# Patient Record
Sex: Female | Born: 1975 | Race: Black or African American | Hispanic: No | Marital: Single | Smoking: Never smoker
Health system: Southern US, Community
[De-identification: ages and names within clinical notes are randomized; demographics above are authoritative.]

## PROBLEM LIST (undated history)

## (undated) DIAGNOSIS — Z973 Presence of spectacles and contact lenses: Secondary | ICD-10-CM

## (undated) DIAGNOSIS — J329 Chronic sinusitis, unspecified: Secondary | ICD-10-CM

## (undated) DIAGNOSIS — T783XXA Angioneurotic edema, initial encounter: Secondary | ICD-10-CM

## (undated) DIAGNOSIS — J302 Other seasonal allergic rhinitis: Secondary | ICD-10-CM

## (undated) DIAGNOSIS — A6009 Herpesviral infection of other urogenital tract: Secondary | ICD-10-CM

## (undated) HISTORY — DX: Herpesviral infection of other urogenital tract: A60.09

## (undated) HISTORY — DX: Presence of spectacles and contact lenses: Z97.3

## (undated) HISTORY — DX: Chronic sinusitis, unspecified: J32.9

## (undated) HISTORY — DX: Other seasonal allergic rhinitis: J30.2

## (undated) HISTORY — DX: Angioneurotic edema, initial encounter: T78.3XXA

---

## 2003-05-23 ENCOUNTER — Other Ambulatory Visit: Admission: RE | Admit: 2003-05-23 | Discharge: 2003-05-23 | Payer: Self-pay | Admitting: Obstetrics and Gynecology

## 2003-09-02 ENCOUNTER — Ambulatory Visit (HOSPITAL_COMMUNITY): Admission: RE | Admit: 2003-09-02 | Discharge: 2003-09-02 | Payer: Self-pay | Admitting: Obstetrics and Gynecology

## 2004-01-20 ENCOUNTER — Inpatient Hospital Stay (HOSPITAL_COMMUNITY): Admission: RE | Admit: 2004-01-20 | Discharge: 2004-01-23 | Payer: Self-pay | Admitting: Obstetrics and Gynecology

## 2004-11-25 ENCOUNTER — Other Ambulatory Visit: Admission: RE | Admit: 2004-11-25 | Discharge: 2004-11-25 | Payer: Self-pay | Admitting: Obstetrics and Gynecology

## 2006-06-11 ENCOUNTER — Encounter (INDEPENDENT_AMBULATORY_CARE_PROVIDER_SITE_OTHER): Payer: Self-pay | Admitting: *Deleted

## 2006-06-11 ENCOUNTER — Inpatient Hospital Stay (HOSPITAL_COMMUNITY): Admission: AD | Admit: 2006-06-11 | Discharge: 2006-06-14 | Payer: Self-pay | Admitting: Obstetrics and Gynecology

## 2007-08-22 ENCOUNTER — Ambulatory Visit: Payer: Self-pay | Admitting: Family Medicine

## 2007-08-24 ENCOUNTER — Ambulatory Visit: Payer: Self-pay | Admitting: Family Medicine

## 2009-01-08 ENCOUNTER — Ambulatory Visit: Payer: Self-pay | Admitting: Family Medicine

## 2009-02-12 ENCOUNTER — Emergency Department (HOSPITAL_COMMUNITY): Admission: EM | Admit: 2009-02-12 | Discharge: 2009-02-13 | Payer: Self-pay | Admitting: Emergency Medicine

## 2009-02-26 ENCOUNTER — Ambulatory Visit: Payer: Self-pay | Admitting: Family Medicine

## 2009-06-24 ENCOUNTER — Ambulatory Visit: Payer: Self-pay | Admitting: Family Medicine

## 2010-04-27 ENCOUNTER — Ambulatory Visit: Payer: Self-pay | Admitting: Physician Assistant

## 2010-07-24 ENCOUNTER — Emergency Department: Payer: Self-pay | Admitting: Emergency Medicine

## 2010-07-28 ENCOUNTER — Ambulatory Visit: Payer: Self-pay | Admitting: Family Medicine

## 2011-01-10 ENCOUNTER — Institutional Professional Consult (permissible substitution): Payer: PRIVATE HEALTH INSURANCE

## 2011-01-12 ENCOUNTER — Ambulatory Visit (INDEPENDENT_AMBULATORY_CARE_PROVIDER_SITE_OTHER): Payer: PRIVATE HEALTH INSURANCE | Admitting: *Deleted

## 2011-01-12 DIAGNOSIS — L0231 Cutaneous abscess of buttock: Secondary | ICD-10-CM

## 2011-01-12 DIAGNOSIS — L03317 Cellulitis of buttock: Secondary | ICD-10-CM

## 2011-01-14 ENCOUNTER — Ambulatory Visit (INDEPENDENT_AMBULATORY_CARE_PROVIDER_SITE_OTHER): Payer: PRIVATE HEALTH INSURANCE | Admitting: *Deleted

## 2011-01-14 DIAGNOSIS — L0291 Cutaneous abscess, unspecified: Secondary | ICD-10-CM

## 2011-01-14 DIAGNOSIS — L0390 Cellulitis, unspecified: Secondary | ICD-10-CM

## 2011-02-04 NOTE — Discharge Summary (Signed)
[name], [name]                ACCOUNT NO.:  [id number]   MEDICAL RECORD NO.:  [id number]          PATIENT TYPE:  INP   LOCATION:  9148                          FACILITY:  [facility]   PHYSICIAN:  Ilda Mori, M.D.   DATE OF BIRTH:  06-Dec-1975   DATE OF ADMISSION:  06/11/2006  DATE OF DISCHARGE:  06/14/2006                                 DISCHARGE SUMMARY   FINAL DIAGNOSIS:  1. Intrauterine pregnancy at 39-6/[redacted] weeks gestation.  2. History of a prior cesarean section.  The patient desires to try a      vaginal birth after cesarean section.  3. Failure to progress and thick meconium-stained amniotic fluid.   PROCEDURE:  Repeat low segment transverse cesarean section.   SURGEON:  Randye Lobo, M.D.   COMPLICATIONS:  None.   HOSPITAL COURSE:  This 35 year old G3, P 1-0-1-1 presents at 39-6/[redacted] weeks  gestation in active labor.  The patient's antepartum course, at this point  had been complicated by a history of a prior cesarean section in 2005  secondary to macrosomia; and the patient decided she would like to try a  trial of vaginal birth with this current pregnancy.  The patient did have an  ultrasound performed on September 5 which documented an estimated fetal  weight of about 3000 grams.  Otherwise, the patient's antepartum course had  been complicated by a history of HSV.  The patient was started on Valtrex  suppressive therapy daily to prevent an outbreak at the time delivery.  The  patient also had a positive group B strep culture obtained in the office at  36 weeks.   Upon admission the patient was about fingertip dilated, cervix was long, and  the station was about a -3 to a -4.  The patient was started on clindamycin  for her positive group B strep AROM was performed and __________ were placed  as was amnio infusion.  The patient did receive an epidural.  The patient  dilated about 3 cm dilation at 70% effaced at a minus 3 station at which  time she had an arrest of  her labor.  A discussion and decision was held  with the patient and decided to proceed with a cesarean section.  She was  taken to the operating room on June 11, 2006 by Dr. Conley Simmonds where a  repeat low segment transverse cesarean section was performed with the  delivery of an 8 pound 2 ounce female infant with Apgars of 9 and 9.  Delivery without complications.  The patient's postoperative course was  benign without any significant fevers.  She was felt ready for discharge on  postoperative day #3; sent home on a regular diet, told to decrease  activities, told to continue her prenatal vitamins;  and was given Percocet #20 to take one to two q.4-6 h. as needed for pain.  She was told she could take over-the-counter Motrin up to 600 mg q.6 h. as  needed for pain; and was to followup in our office in 4 weeks.  Labs on  discharge showed a hemoglobin of  9.7, white blood cell count of 15.3 and  platelets of 261,000.      [name], P.A.-C.      Ilda Mori, M.D.  Electronically Signed    MB/MEDQ  D:  07/08/2006  T:  07/10/2006  Job:  [id number]

## 2011-02-04 NOTE — Discharge Summary (Signed)
NAME:  AOI, KOUNS                           ACCOUNT NO.:  [id number]   MEDICAL RECORD NO.:  [id number]                   PATIENT TYPE:  INP   LOCATION:  9137                                 FACILITY[facility]  [facility]   PHYSICIAN:  Carrington Clamp, M.D.              DATE OF BIRTH:  1975/09/21   DATE OF ADMISSION:  01/20/2004  DATE OF DISCHARGE:  01/23/2004                                 DISCHARGE SUMMARY   FINAL DIAGNOSES:  1. Intrauterine pregnancy at [redacted] weeks gestation.  2. Fetal macrosomia.   PROCEDURE:  Primary low segment transverse cesarean section.   SURGEON:  Dr. Conley Simmonds.   ASSISTANT:  Dr. Malva Limes.   COMPLICATIONS:  None.   This 35 year old G2 P-0-0-1-0 presents at 45 and three-sevenths weeks  gestation for a primary low transverse cesarean section.  The patient's  antepartum course was complicated by measuring size greater than dates.  The  patient was followed with ultrasound examinations with the most recent being  on January 08, 2004 documenting an estimated fetal weight of 3942 g.  The  patient did have a normal glucose tolerance test and did have a weight gain  of 29 pounds during her pregnancy.  Ultrasound did document a right renal  pyelectasis measuring 7 mm but did not increase past the 1 cm mark and  therefore had no follow-up.  The patient's cervix was long and thick and  closed.  A discussion was held with the patient regarding her options.  A  decision was made to proceed with a cesarean section.  The patient was taken  to the operating room on Jan 20, 2004 by Dr. Conley Simmonds where a primary low  transverse cesarean section was performed with the delivery of a 9-pound 4-  ounce female infant with Apgars of 9 and 9.  Delivery went without  complications.  The patient's postoperative course was benign without  significant fevers.  She was having some problems with breastfeeding and was  seen by the lactation consultant.  She was felt ready for discharge on  postoperative day #3.  She was sent home on a regular diet, told to decrease  activities, told to continue prenatal vitamins and her iron supplements  daily, was given a prescription for Percocet one to two q.4h. as needed for  pain, told she could use over-the-counter pain medicine as needed, was to  follow up in the office in 4 weeks.   LABORATORY DATA ON DISCHARGE:  The patient had a hemoglobin of 9.6, white  blood cell count of 14.6.     [name], P.A.-C.                Carrington Clamp, M.D.    MB/MEDQ  D:  02/02/2004  T:  02/02/2004  Job:  [id number]

## 2011-02-04 NOTE — Op Note (Signed)
NAMEHILJA, [name]                ACCOUNT NO.:  [id number]   MEDICAL RECORD NO.:  [id number]          PATIENT TYPE:  INP   LOCATION:  9148                          [facility]ACILITY[facility]  [facility]   PHYSICIAN:  Randye Lobo, M.D.   DATE OF BIRTH:  1975/12/04   DATE OF PROCEDURE:  06/11/2006  DATE OF DISCHARGE:                                 OPERATIVE REPORT   PREOPERATIVE DIAGNOSES:  1. Intrauterine gestation at 39 +6 weeks.  2. History of prior cesarean section, desires vaginal birth after cesarean      section.  3. Failure to progress.  4. Thick meconium amniotic fluid.   POSTOPERATIVE DIAGNOSES:  1. Intrauterine gestation at 39 +6 weeks.  2. History of prior cesarean section, desirous vaginal birth after      cesarean section.  3. Failure to progress.  4. Thick meconium amniotic fluid.   PROCEDURE:  A repeat low segment transverse cesarean section.   SURGEON:  Randye Lobo, M.D.   ANESTHESIA:  Epidural.   IV FLUIDS:  1000 mL Ringer's lactate.   ESTIMATED BLOOD LOSS:  500 mL.   URINE OUTPUT:  450 mL.   COMPLICATIONS:  None.   INDICATIONS FOR PROCEDURE:  The patient is a 35 year old gravida 3, para 1-0-  1-1 African American female at 65 +6 weeks' gestation, ([facility] June 12, 2006, by last menstrual period and first trimester ultrasound) who has a  history of prior cesarean section for fetal macrosomia in 2005, who  presented on the morning of June 11, 2006, with contractions.  The  patient, throughout her antenatal course, indicated the desire for a trial  of vaginal birth after cesarean section and a plan was made to proceed with  this trial of labor after risks, benefits, and alternatives were discussed  with the patient.  The patient did have an ultrasound performed on May 24, 2006, which documented an estimated fetal weight of 3000 g.  The  patient's cervical exam upon admission was fingertip dilated and the cervix  was long and soft and with the fetal vertex  at the -3 to -4 station.  The  fetal heart rate tracing was reactive.  The patient also had a history of  group B strep and she was therefore begun on clindamycin IV.  Artificial  rupture of membranes was performed when the patient was 1 cm dilated and  thick meconium amniotic fluid was appreciated.  An IUPC was placed and an  amnioinfusion was performed.  The patient went on to receive an epidural for  anesthesia.  The patient was noted to have inadequate contractions and low-  dose Pitocin was begun.  The patient achieved 3 cm of dilation with 70%  effacement and the vertex at the -3 station at which time she had arrest of  her labor.  A plan was made to proceed with a repeat cesarean section after  risks, benefits, and alternatives were discussed.   FINDINGS:  A viable female was delivered at 2256.  Apgars were 9 at 1 minute  and 9 at 5 minutes.  The weight  was 8 pounds 2 ounces.  The amniotic fluid  had thick meconium staining.  The placenta had a normal insertion of a three-  vessel cord.  The uterus, tubes and ovaries were unremarkable.   SPECIMEN:  The placenta was sent to pathology.   PROCEDURE:  The patient was taken from her labor and delivery suite down to  the operating room where her epidural was dosed for surgical anesthesia.   The patient's abdomen was sterilely prepped and draped.  She previously had  a Foley catheter placed.   A Pfannenstiel incision was created along the line of the patient's previous  incision.  A scalpel was used to perform the incision and carry it down to  the level of the rectus fascia.  The fascial incision was extended  bilaterally with Mayo scissors.  The rectus muscles were sharply dissected  off of the fascia superiorly and inferiorly.  The rectus muscles were  sharply divided in the midline.  The parietal peritoneum was elevated with  two hemostat clamps and was entered sharply with the Metzenbaum scissors.  The peritoneal incision was  extended cranially and caudally.   The bladder retractor was placed over the lower uterine segment and the  bladder flap was then sharply created.  A transverse lower uterine segment  incision was created sharply with the scalpel.  The incision was extended  bilaterally in an upward fashion with the bandage scissors.  A hand was  inserted through the uterine incision and the vertex was delivered.  The  nares and mouth were suctioned with DeLee suction at this time.  The  remainder of the newborn infant was delivered.  The newborn had a  spontaneous cry at this time.  The cord was doubly clamped and cut and the  newborn was carried over to the awaiting pediatricians.   Cord blood was obtained and then the placenta was manually extracted.  The  placenta was set aside for cord blood donation and the placenta was then  sent to pathology.   The patient did receive Pitocin 20 units IV.  The uterus was exteriorized at  this time.  A moistened lap pad was used to wipe the uterine cavity clean.  The uterine incision was then closed with a double layer closure #1 chromic.  The first was a running locked layer and the second was an imbricating  layer.  An additional simple suture and a figure-of-eight suture were used  to create hemostasis along the inferior aspect of the uterine incision.  The  uterus was returned to the peritoneal cavity which was then irrigated and  suctioned.  The uterine incision was noted to be hemostatic.   The abdomen was closed.  The parietal peritoneum was closed with a running  suture of 3-0 Vicryl.  The rectus muscles were reapproximated with  interrupted sutures of #1 chromic.  The fascia was closed with a running  suture of 0 Vicryl.  The subcutaneous tissue was irrigated and suctioned and  made hemostatic with monopolar cautery.  The subcutaneous tissue was  undermined to remove any retraction along the Pfannenstiel incision. Interrupted sutures of 3-0 plain suture  were used to close the subcutaneous  layer.  The skin was closed with staples and a sterile pressure bandage was  placed over this.   This concluded the patient's procedure.  There were no complications.  All  needle, instrument and sponge counts were correct.      Randye Lobo, M.D.  Electronically Signed  BES/MEDQ  D:  06/11/2006  T:  06/13/2006  Job:  [id number]

## 2011-02-04 NOTE — Op Note (Signed)
NAME:  [name], [name]                           ACCOUNT NO.:  [id number]   MEDICAL RECORD NO.:  [id number]                   PATIENT TYPE:  INP   LOCATION:  9137                                 FACILITY[facility]   PHYSICIAN:  Randye Lobo, M.D.                DATE OF BIRTH:  [birth year]   DATE OF PROCEDURE:  01/20/2004  DATE OF DISCHARGE:                                 OPERATIVE REPORT   PREOPERATIVE DIAGNOSES:  1. Intrauterine gestation at 39+ three weeks.  2. Suspected fetal macrosomia.   POSTOPERATIVE DIAGNOSES:  1. Intrauterine gestation at 79 plus three weeks.  2. Suspected fetal macrosomia.   PROCEDURE:  Primary low segment transverse cesarean section.   SURGEON:  Randye Lobo, M.D.   ASSISTANT:  Malva Limes, M.D.   IV FLUIDS:  2000 cc Ringer's lactate.   ESTIMATED BLOOD LOSS:  800 cc.   URINE OUTPUT:  400 cc.   COMPLICATIONS:  None.   INDICATIONS FOR PROCEDURE:  The patient is a 35 year old gravida 2, para 0-0-  1-0 African-American female at 38 plus three weeks gestation, by last  menstrual period and first trimester ultrasound.  She presented during her  antepartum course measuring size greater than dates.  The patient was  followed with ultrasound examinations, and the most recent ultrasound on  January 08, 2004 documented an estimated fetal weight of 3943 g, with an  amniotic fluid index of 17.3.  The patient had a normal glucose tolerance  test and had a total weight gain of 29 pounds during the pregnancy.  Ultrasound did document right renal polyectasis measuring 7 mm.   The patient's cervix was noted to be closed and thick, and the vertex was  not descending into the pelvis on office exams.  A discussion was held with  the patient regarding options for care.  A plan was made to proceed with a  primary low segment transverse cesarean section, after the risks, benefits  and alternatives were discussed with she and her husband.   FINDINGS:  A viable female  delivered at 68 on Jan 20, 2004.  Apgars were 9  at one minute and 9 at five minutes.  There was a double nuchal cord, which  was reduced.  The weight was later noted to be 9 pounds 4 ounces.  The  amniotic fluid was clear.  The placenta had a normal insertion of a three-  vessel cord.  The uterus, tubes and ovaries were normal.  There was a small  bandlike adhesion located between the left fallopian tube and the left  mesosalpinx, which was lysed using monopolar cautery.   SPECIMENS:  None.   DESCRIPTION OF PROCEDURE:  The patient was reidentified in the preoperative  hold area.  She was brought to the operating room, where spinal anesthetic  was administered.  The patient was then placed in a supine position, with a  left lateral tilt. The abdomen was sterilely prepped and a Foley catheter  placed inside the bladder.  She was then sterilely draped.   A Pfannenstiel incision was then created sharply with a scalpel and carried  down to the level of the fascia, using sharp dissection with the scalpel and  monopolar cautery to create hemostasis in the subcutaneous layer.  The  fascia was then incised in the midline and the incision extended with the  Mayo scissors bilaterally.  The rectus muscles were dissected off of the  overlying fascia superiorly and inferiorly, and the rectus muscles were  sharply divided in the midline.  The parietoperitoneum was elevated with two  Hemostat clamps and entered sharply.  The incision was then extended  cranially and caudally, with care taken to avoid enterotomy and cystotomy.   The lower uterine segment of the uterus was exposed and a bladder flap was  then sharply created.  A transverse lower uterine segment incision was then  created with a scalpel, and the incision was extended bilaterally in an  upward fashion with a bandage scissors.  Membranes were ruptured and clear  fluid was noted.  The vertex was delivered through the uterine incision   without difficulty, and the nares and mouth were suctioned.  The cord was  reduced from its double nuchal position and the remainder of the newborn was  delivered.  The cord was then doubly clamped and cut and the newborn was  carried over to the pediatricians in vigorous condition.  Cord blood was  obtained and the placenta was then manually extracted.  The uterus was  exteriorized for its closure.  A moistened lap pad was used to remove any  remaining membranes from within the uterine cavity, and there were none.   The uterus was closed in a double-layered closure of #1 chromic.  The first  layer was a running locked layer, and the second layer was an embrocating  layer.  There was a small amount of bleeding noted at the site of suture  placement of the inferior aspect of the right side of the incision, and this  responded well to a figure-of-eight suture of #1 chromic for excellent  hemostasis.   The pelvis was then suctioned of any remaining blood and fluid. The uterus  was returned to the peritoneal cavity.  It was reexamined and found to be  hemostatic.  Prior to replacing the uterus in the peritoneal cavity, please  note that the small adhesion band in the left adnexa was lysed with  monopolar cautery (as noted above).   Hemostasis was noted at all of the operative sites and the abdomen was  therefore closed. A running suture of 3-0 Vicryl was used to close the  peritoneal cavity.  The rectus muscles were reapproximated using figure-of-  eight sutures of #1 chromic.  The fascia was closed with a running suture of  0 Vicryl.  The subcutaneous tissue was irrigated and made hemostatic with  monopolar cautery.  The skin was then closed with staples.  A sterile  bandage was placed over the incision.   There were no complications to the procedure.  All needle, instrument and sponge counts were correct. The patient was escorted to the recovery room in  stable condition.  Randye Lobo, M.D.    BES/MEDQ  D:  01/20/2004  T:  01/21/2004  Job:  [id number]

## 2011-08-18 ENCOUNTER — Encounter: Payer: Self-pay | Admitting: Family Medicine

## 2011-08-18 ENCOUNTER — Ambulatory Visit: Payer: PRIVATE HEALTH INSURANCE | Admitting: Medical

## 2011-12-03 ENCOUNTER — Encounter (HOSPITAL_BASED_OUTPATIENT_CLINIC_OR_DEPARTMENT_OTHER): Payer: Self-pay | Admitting: *Deleted

## 2011-12-03 ENCOUNTER — Emergency Department (HOSPITAL_BASED_OUTPATIENT_CLINIC_OR_DEPARTMENT_OTHER)
Admission: EM | Admit: 2011-12-03 | Discharge: 2011-12-04 | Disposition: A | Discharge status: Home / Self Care | Payer: PRIVATE HEALTH INSURANCE | Attending: Emergency Medicine | Admitting: Emergency Medicine

## 2011-12-03 DIAGNOSIS — A6000 Herpesviral infection of urogenital system, unspecified: Secondary | ICD-10-CM | POA: Insufficient documentation

## 2011-12-03 DIAGNOSIS — K5289 Other specified noninfective gastroenteritis and colitis: Secondary | ICD-10-CM | POA: Insufficient documentation

## 2011-12-03 DIAGNOSIS — K529 Noninfective gastroenteritis and colitis, unspecified: Secondary | ICD-10-CM

## 2011-12-03 DIAGNOSIS — B349 Viral infection, unspecified: Secondary | ICD-10-CM

## 2011-12-03 DIAGNOSIS — B9789 Other viral agents as the cause of diseases classified elsewhere: Secondary | ICD-10-CM | POA: Insufficient documentation

## 2011-12-03 MED ORDER — SODIUM CHLORIDE 0.9 % IV BOLUS (SEPSIS)
1000.0000 mL | Freq: Once | INTRAVENOUS | Status: AC
  Administered 2011-12-04: 1000 mL via INTRAVENOUS

## 2011-12-03 MED ORDER — KETOROLAC TROMETHAMINE 30 MG/ML IJ SOLN
30.0000 mg | Freq: Once | INTRAMUSCULAR | Status: AC
  Administered 2011-12-04: 30 mg via INTRAVENOUS
  Filled 2011-12-03: qty 1

## 2011-12-03 MED ORDER — ONDANSETRON HCL 4 MG/2ML IJ SOLN
4.0000 mg | Freq: Once | INTRAMUSCULAR | Status: AC
  Administered 2011-12-04: 4 mg via INTRAVENOUS
  Filled 2011-12-03: qty 2

## 2011-12-03 NOTE — ED Notes (Signed)
Pt presents to ED today with N/V/D for 3 days.  Pt also c/o URI sx for 1 week.  Pt has been taking Motrin at home with no relief in sx

## 2011-12-03 NOTE — ED Provider Notes (Addendum)
History    This chart was scribed for Cyndra Numbers, MD, MD by Smitty Pluck. The patient was seen in room MH11 and the patient's care was started at 11:49PM.   CSN: [id number]  Arrival date & time 12/03/11  2126   First MD Initiated Contact with Patient 12/03/11 2257      Chief Complaint  Patient presents with  . nausea, vomitting, diarrhea     (Consider location/radiation/quality/duration/timing/severity/associated sxs/prior treatment) The history is provided by the patient.   [name] is a 36 y.o. female who presents to the Emergency Department complaining of nausea, vomiting and diarrhea with onset 3 days ago. Pt denies dysuria. She reports that she has mild upper abdominal pain. She has had mild cough. She has had sick contact with daughter (similar symptoms 2 days).  The symptoms have been constant since onset without radiation. Pt has had 2 c-sections and no other abdominal surgeries. She reports she has been told that she has 2 hernias. She has taken nasal spray with minor relief. She rates her pain as minimal. There are no other associated or modifying factors.   Past Medical History  Diagnosis Date  . Herpes genitalis in women   . Contraceptive management     History reviewed. No pertinent past surgical history.  Family History  Problem Relation Age of Onset  . Hypertension Mother   . Arthritis Father   . Arthritis Paternal Grandmother     History  Substance Use Topics  . Smoking status: Not on file  . Smokeless tobacco: Not on file  . Alcohol Use:     OB History    Grav Para Term Preterm Abortions TAB SAB Ect Mult Living                  Review of Systems  Constitutional: Positive for chills, appetite change and fatigue.  HENT: Positive for congestion.   Eyes: Negative.   Respiratory: Positive for cough.   Cardiovascular: Negative.   Gastrointestinal: Positive for nausea, vomiting, abdominal pain and diarrhea.  Genitourinary: Negative.     Musculoskeletal: Positive for myalgias.  Skin: Negative.   Neurological: Negative.   Hematological: Negative.   Psychiatric/Behavioral: Negative.   All other systems reviewed and are negative.   10 Systems reviewed and are negative for acute change except as noted in the HPI.  Allergies  Cephalosporins and Penicillins  Home Medications   Current Outpatient Rx  Name Route Sig Dispense Refill  . BISMUTH SUBSALICYLATE 262 MG/15ML PO SUSP Oral Take 15 mLs by mouth every 6 (six) hours as needed. Patient used this medication for an upset stomach.    . IBUPROFEN 200 MG PO TABS Oral Take 200 mg by mouth every 6 (six) hours as needed. Patient used this medication for a fever.    Vladimir Creeks ESTRAD-FE 1-20/1-30/1-35 MG-MCG PO TABS Oral Take 1 tablet by mouth daily. LO-ESTRIN 24     . SULFAMETHOXAZOLE-TRIMETHOPRIM 800-160 MG PO TABS Oral Take 1 tablet by mouth 2 (two) times daily.        BP 110/57  Pulse 101  Temp(Src) 99 F (37.2 C) (Oral)  Resp 20  SpO2 99%  Physical Exam  Nursing note and vitals reviewed. Constitutional: She is oriented to person, place, and time. She appears well-developed and well-nourished. No distress.  HENT:  Head: Normocephalic and atraumatic.  Eyes: Conjunctivae are normal. Pupils are equal, round, and reactive to light.  Neck: Normal range of motion. No tracheal deviation present.  Cardiovascular:  Normal rate, regular rhythm and normal heart sounds.   Pulmonary/Chest: Effort normal and breath sounds normal. No respiratory distress.  Abdominal: Soft. Bowel sounds are normal. She exhibits no distension. There is tenderness.       Mild TTP in epigastrium  Musculoskeletal: Normal range of motion. She exhibits no edema.  Neurological: She is alert and oriented to person, place, and time. No cranial nerve deficit. She exhibits normal muscle tone. Coordination normal.  Skin: Skin is warm and dry.  Psychiatric: She has a normal mood and affect. Her  behavior is normal.    ED Course  Procedures (including critical care time) DIAGNOSTIC STUDIES: Oxygen Saturation is 99% on room air, normal by my interpretation.    COORDINATION OF CARE: 11:55PM EDP orders medication: zofran 4 mg, toradol 30 mg/ml, NaCl 0.9% bolus    Labs Reviewed  BASIC METABOLIC PANEL - Abnormal; Notable for the following:    Glucose, Bld 115 (*)    BUN 4 (*)    All other components within normal limits  URINALYSIS, ROUTINE W REFLEX MICROSCOPIC  PREGNANCY, URINE   Dg Chest 2 View  12/04/2011  *RADIOLOGY REPORT*  Clinical Data: Cough, nausea and vomiting.  CHEST - 2 VIEW  Comparison: None.  Findings: The lungs are well-aerated and clear.  There is no evidence of focal opacification, pleural effusion or pneumothorax.  The heart is normal in size; the mediastinal contour is within normal limits.  No acute osseous abnormalities are seen.  IMPRESSION: No acute cardiopulmonary process seen.  Original Report Authenticated By: Tonia Ghent, M.D.     1. Gastroenteritis   2. Viral syndrome       MDM  Patient was evaluated by myself. Based on evaluation patient had workup to make sure she did not have significant dehydration. She was not pregnant and had no concerning findings on her urine today. Patient also was not dehydrated to the point that she has compromised renal function. She did have a chest x-ray performed given that she had had cough as well preceding her nausea and vomiting. This was unremarkable. Patient was treated with normal saline as well as Zofran and Toradol. Following this patient was feeling better. Patient was told that she likely has a viral gastroenteritis. She was discharged home in good condition with a prescription for Zofran. She can followup with her primary care physician as needed.  I personally performed the services described in this documentation, which was scribed in my presence. The recorded information has been reviewed and  considered.          Cyndra Numbers, MD 12/04/11 1610  Cyndra Numbers, MD 12/04/11 [phone]

## 2011-12-04 ENCOUNTER — Emergency Department (INDEPENDENT_AMBULATORY_CARE_PROVIDER_SITE_OTHER): Payer: PRIVATE HEALTH INSURANCE

## 2011-12-04 DIAGNOSIS — R05 Cough: Secondary | ICD-10-CM

## 2011-12-04 DIAGNOSIS — R112 Nausea with vomiting, unspecified: Secondary | ICD-10-CM

## 2011-12-04 DIAGNOSIS — R059 Cough, unspecified: Secondary | ICD-10-CM

## 2011-12-04 LAB — BASIC METABOLIC PANEL
BUN: 4 mg/dL — ABNORMAL LOW (ref 6–23)
CO2: 26 mEq/L (ref 19–32)
Calcium: 9 mg/dL (ref 8.4–10.5)
Chloride: 101 mEq/L (ref 96–112)
Creatinine, Ser: 0.6 mg/dL (ref 0.50–1.10)
GFR calc Af Amer: >90 mL/min (ref >90)
GFR calc non Af Amer: >90 mL/min (ref >90)
Glucose, Bld: 115 mg/dL — ABNORMAL HIGH (ref 70–99)
Potassium: 3.8 mEq/L (ref 3.5–5.1)
Sodium: 136 mEq/L (ref 135–145)

## 2011-12-04 LAB — URINALYSIS, ROUTINE W REFLEX MICROSCOPIC
Bilirubin Urine: NEGATIVE
Glucose, UA: NEGATIVE mg/dL
Hgb urine dipstick: NEGATIVE
Ketones, ur: NEGATIVE mg/dL
Leukocytes, UA: NEGATIVE
Nitrite: NEGATIVE
Protein, ur: NEGATIVE mg/dL
Specific Gravity, Urine: 1.009 (ref 1.005–1.030)
Urobilinogen, UA: 1 mg/dL (ref 0.0–1.0)
pH: 7.5 (ref 5.0–8.0)

## 2011-12-04 LAB — PREGNANCY, URINE: Preg Test, Ur: NEGATIVE

## 2011-12-04 MED ORDER — ONDANSETRON 8 MG PO TBDP: 8.0000 mg | ORAL_TABLET | Freq: Three times a day (TID) | ORAL | Status: AC | PRN

## 2011-12-04 NOTE — ED Notes (Signed)
rx x 1 given for zofran 

## 2011-12-04 NOTE — Discharge Instructions (Signed)
Viral Infections A viral infection can be caused by different types of viruses.Most viral infections are not serious and resolve on their own. However, some infections may cause severe symptoms and may lead to further complications. SYMPTOMS Viruses can frequently cause:  Minor sore throat.   Aches and pains.   Headaches.   Runny nose.   Different types of rashes.   Watery eyes.   Tiredness.   Cough.   Loss of appetite.   Gastrointestinal infections, resulting in nausea, vomiting, and diarrhea.  These symptoms do not respond to antibiotics because the infection is not caused by bacteria. However, you might catch a bacterial infection following the viral infection. This is sometimes called a "superinfection." Symptoms of such a bacterial infection may include:  Worsening sore throat with pus and difficulty swallowing.   Swollen neck glands.   Chills and a high or persistent fever.   Severe headache.   Tenderness over the sinuses.   Persistent overall ill feeling (malaise), muscle aches, and tiredness (fatigue).   Persistent cough.   Yellow, green, or brown mucus production with coughing.  HOME CARE INSTRUCTIONS   Only take over-the-counter or prescription medicines for pain, discomfort, diarrhea, or fever as directed by your caregiver.   Drink enough water and fluids to keep your urine clear or pale yellow. Sports drinks can provide valuable electrolytes, sugars, and hydration.   Get plenty of rest and maintain proper nutrition. Soups and broths with crackers or rice are fine.  SEEK IMMEDIATE MEDICAL CARE IF:   You have severe headaches, shortness of breath, chest pain, neck pain, or an unusual rash.   You have uncontrolled vomiting, diarrhea, or you are unable to keep down fluids.   You or your child has an oral temperature above 102 F (38.9 C), not controlled by medicine.   Your baby is older than 3 months with a rectal temperature of 102 F (38.9 C) or  higher.   Your baby is 14 months old or younger with a rectal temperature of 100.4 F (38 C) or higher.  MAKE SURE YOU:   Understand these instructions.   Will watch your condition.   Will get help right away if you are not doing well or get worse.  Document Released: 06/15/2005 Document Revised: 08/25/2011 Document Reviewed: 01/10/2011 [facility] Patient Information 2012 [place], [place].Viral Gastroenteritis Viral gastroenteritis is also known as stomach flu. This condition affects the stomach and intestinal tract. It can cause sudden diarrhea and vomiting. The illness typically lasts 3 to 8 days. Most people develop an immune response that eventually gets rid of the virus. While this natural response develops, the virus can make you quite ill. CAUSES  Many different viruses can cause gastroenteritis, such as rotavirus or noroviruses. You can catch one of these viruses by consuming contaminated food or water. You may also catch a virus by sharing utensils or other personal items with an infected person or by touching a contaminated surface. SYMPTOMS  The most common symptoms are diarrhea and vomiting. These problems can cause a severe loss of body fluids (dehydration) and a body salt (electrolyte) imbalance. Other symptoms may include:  Fever.   Headache.   Fatigue.   Abdominal pain.  DIAGNOSIS  Your caregiver can usually diagnose viral gastroenteritis based on your symptoms and a physical exam. A stool sample may also be taken to test for the presence of viruses or other infections. TREATMENT  This illness typically goes away on its own. Treatments are aimed at rehydration. The  most serious cases of viral gastroenteritis involve vomiting so severely that you are not able to keep fluids down. In these cases, fluids must be given through an intravenous line (IV). HOME CARE INSTRUCTIONS   Drink enough fluids to keep your urine clear or pale yellow. Drink small amounts of fluids frequently  and increase the amounts as tolerated.   Ask your caregiver for specific rehydration instructions.   Avoid:   Foods high in sugar.   Alcohol.   Carbonated drinks.   Tobacco.   Juice.   Caffeine drinks.   Extremely hot or cold fluids.   Fatty, greasy foods.   Too much intake of anything at one time.   Dairy products until 24 to 48 hours after diarrhea stops.   You may consume probiotics. Probiotics are active cultures of beneficial bacteria. They may lessen the amount and number of diarrheal stools in adults. Probiotics can be found in yogurt with active cultures and in supplements.   Wash your hands well to avoid spreading the virus.   Only take over-the-counter or prescription medicines for pain, discomfort, or fever as directed by your caregiver. Do not give aspirin to children. Antidiarrheal medicines are not recommended.   Ask your caregiver if you should continue to take your regular prescribed and over-the-counter medicines.   Keep all follow-up appointments as directed by your caregiver.  SEEK IMMEDIATE MEDICAL CARE IF:   You are unable to keep fluids down.   You do not urinate at least once every 6 to 8 hours.   You develop shortness of breath.   You notice blood in your stool or vomit. This may look like coffee grounds.   You have abdominal pain that increases or is concentrated in one small area (localized).   You have persistent vomiting or diarrhea.   You have a fever.   The patient is a child younger than 3 months, and he or she has a fever.   The patient is a child older than 3 months, and he or she has a fever and persistent symptoms.   The patient is a child older than 3 months, and he or she has a fever and symptoms suddenly get worse.   The patient is a baby, and he or she has no tears when crying.  MAKE SURE YOU:   Understand these instructions.   Will watch your condition.   Will get help right away if you are not doing well or get  worse.  Document Released: 09/05/2005 Document Revised: 08/25/2011 Document Reviewed: 06/22/2011 [facility] Patient Information 2012 [place], [place].

## 2012-09-19 HISTORY — PX: HERNIA REPAIR: SHX51

## 2013-02-08 ENCOUNTER — Encounter: Payer: Self-pay | Admitting: Internal Medicine

## 2013-02-26 ENCOUNTER — Other Ambulatory Visit (HOSPITAL_COMMUNITY)
Admission: RE | Admit: 2013-02-26 | Discharge: 2013-02-26 | Disposition: A | Discharge status: Home / Self Care | Payer: Medicaid Other | Source: Ambulatory Visit | Attending: Medical | Admitting: Medical

## 2013-02-26 ENCOUNTER — Ambulatory Visit (INDEPENDENT_AMBULATORY_CARE_PROVIDER_SITE_OTHER): Payer: Medicaid Other | Admitting: Medical

## 2013-02-26 ENCOUNTER — Encounter: Payer: Self-pay | Admitting: Medical

## 2013-02-26 VITALS — BP 92/70 | HR 60 | Temp 98.2°F | Resp 16 | Ht 61.5 in | Wt 125.0 lb

## 2013-02-26 DIAGNOSIS — Z01419 Encounter for gynecological examination (general) (routine) without abnormal findings: Secondary | ICD-10-CM | POA: Insufficient documentation

## 2013-02-26 DIAGNOSIS — Z0000 Encounter for general adult medical examination without abnormal findings: Secondary | ICD-10-CM

## 2013-02-26 DIAGNOSIS — Z309 Encounter for contraceptive management, unspecified: Secondary | ICD-10-CM

## 2013-02-26 DIAGNOSIS — Z113 Encounter for screening for infections with a predominantly sexual mode of transmission: Secondary | ICD-10-CM

## 2013-02-26 DIAGNOSIS — Z124 Encounter for screening for malignant neoplasm of cervix: Secondary | ICD-10-CM

## 2013-02-26 DIAGNOSIS — A6000 Herpesviral infection of urogenital system, unspecified: Secondary | ICD-10-CM

## 2013-02-26 LAB — POCT URINALYSIS DIPSTICK
Bilirubin, UA: NEGATIVE
Blood, UA: NEGATIVE
Glucose, UA: NEGATIVE
Ketones, UA: NEGATIVE
Leukocytes, UA: NEGATIVE
Nitrite, UA: NEGATIVE
Protein, UA: NEGATIVE
Spec Grav, UA: <=1.005
Urobilinogen, UA: NEGATIVE
pH, UA: 6

## 2013-02-26 LAB — COMPREHENSIVE METABOLIC PANEL
ALT: 12 U/L (ref 0–35)
AST: 10 U/L (ref 0–37)
Albumin: 4.2 g/dL (ref 3.5–5.2)
Alkaline Phosphatase: 36 U/L — ABNORMAL LOW (ref 39–117)
BUN: 10 mg/dL (ref 6–23)
CO2: 25 mEq/L (ref 19–32)
Calcium: 9.2 mg/dL (ref 8.4–10.5)
Chloride: 108 mEq/L (ref 96–112)
Creat: 0.79 mg/dL (ref 0.50–1.10)
Glucose, Bld: 86 mg/dL (ref 70–99)
Potassium: 4.1 mEq/L (ref 3.5–5.3)
Sodium: 139 mEq/L (ref 135–145)
Total Bilirubin: 0.4 mg/dL (ref 0.3–1.2)
Total Protein: 6.7 g/dL (ref 6.0–8.3)

## 2013-02-26 LAB — CBC WITH DIFFERENTIAL/PLATELET
Basophils Absolute: 0 10*3/uL (ref 0.0–0.1)
Basophils Relative: 0 % (ref 0–1)
Eosinophils Absolute: 0.3 10*3/uL (ref 0.0–0.7)
Eosinophils Relative: 4 % (ref 0–5)
HCT: 34 % — ABNORMAL LOW (ref 36.0–46.0)
Hemoglobin: 11.7 g/dL — ABNORMAL LOW (ref 12.0–15.0)
Lymphocytes Relative: 33 % (ref 12–46)
Lymphs Abs: 2.3 10*3/uL (ref 0.7–4.0)
MCH: 29.8 pg (ref 26.0–34.0)
MCHC: 34.4 g/dL (ref 30.0–36.0)
MCV: 86.5 fL (ref 78.0–100.0)
Monocytes Absolute: 0.4 10*3/uL (ref 0.1–1.0)
Monocytes Relative: 5 % (ref 3–12)
Neutro Abs: 4.1 10*3/uL (ref 1.7–7.7)
Neutrophils Relative %: 58 % (ref 43–77)
Platelets: 283 10*3/uL (ref 150–400)
RBC: 3.93 MIL/uL (ref 3.87–5.11)
RDW: 14.4 % (ref 11.5–15.5)
WBC: 7 10*3/uL (ref 4.0–10.5)

## 2013-02-26 LAB — POCT WET PREP (WET MOUNT)
Clue Cells Wet Prep Whiff POC: NEGATIVE
KOH Wet Prep POC: NEGATIVE
Trichomonas Wet Prep HPF POC: NEGATIVE

## 2013-02-26 LAB — POCT URINE PREGNANCY: Preg Test, Ur: NEGATIVE

## 2013-02-26 LAB — TSH: TSH: 0.881 u[IU]/mL (ref 0.350–4.500)

## 2013-02-26 LAB — LIPID PANEL
Cholesterol: 160 mg/dL (ref 0–200)
HDL: 62 mg/dL (ref >39)
LDL Cholesterol: 87 mg/dL (ref 0–99)
Total CHOL/HDL Ratio: 2.6 Ratio
Triglycerides: 53 mg/dL (ref <150)
VLDL: 11 mg/dL (ref 0–40)

## 2013-02-26 LAB — HM PAP SMEAR: HM Pap smear: NEGATIVE

## 2013-02-26 LAB — RPR

## 2013-02-26 LAB — HIV ANTIBODY (ROUTINE TESTING W REFLEX): HIV: NONREACTIVE

## 2013-02-26 MED ORDER — NORETHIN ACE-ETH ESTRAD-FE 1-20 MG-MCG(24) PO CHEW: 1.0000 | CHEWABLE_TABLET | Freq: Every day | ORAL | Status: DC

## 2013-02-26 NOTE — Progress Notes (Signed)
Subjective:   HPI  [name] is a 37 y.o. female who presents for a complete physical.   Preventative care: Last ophthalmology visit: n/a Last dental visit:yes- [facility] Last colonoscopy:n/a Last mammogram:n/a Last gynecological exam:2013- [facility], pap 2013 Last EKG:n/a Last labs:n/a  Prior vaccinations: TD or Tdap:02/2009 Influenza:n/a Pneumococcal:n/a Shingles/Zostavax:n/a  Advanced directive:n/a Health care power of attorney:n/a Living will:n/a  Concerns: none  Past Medical History  Diagnosis Date  . Herpes genitalis in women   . Contraceptive management   . Seasonal allergic rhinitis   . Sinusitis     history of, worse as teenager    Past Surgical History  Procedure Laterality Date  . Cesarean section      twice    Family History  Problem Relation Age of Onset  . Hypertension Mother   . Heart disease Mother 49    MI, CHF, CABG  . Thyroid disease Mother   . Arthritis Father   . Diabetes Father   . Arthritis Paternal Grandmother   . Heart disease Paternal Grandmother   . Anemia Sister   . Cancer Maternal Grandmother     pancreatic    History   Social History  . Marital Status: Married    Spouse Name: N/A    Number of Children: N/A  . Years of Education: N/A   Occupational History  . Not on file.   Social History Main Topics  . Smoking status: Never Smoker   . Smokeless tobacco: Not on file  . Alcohol Use: No  . Drug Use: No  . Sexually Active: Not on file   Other Topics Concern  . 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  Have 2 children ages 9yo and 76yo.  Newly divorced.      No current outpatient prescriptions on file prior to visit.   No current facility-administered medications on file prior to visit.    Allergies  Allergen Reactions  . Cephalosporins Itching and Swelling  . Penicillins  Itching, Swelling and Other (See Comments)    Patient states that she can't move.   Reviewed their medical, surgical, family, social, medication, and allergy history and updated chart as appropriate.  Review of Systems Constitutional: -fever, -chills, -sweats, -unexpected weight change, -decreased appetite, -fatigue Allergy: -sneezing, -itching, -congestion Dermatology: -+ moles, --rash, -lumps ENT: -runny nose, -ear pain, -sore throat, -hoarseness, -sinus pain, -teeth pain, - ringing in ears, -hearing loss, -nosebleeds Cardiology: -chest pain, -palpitations, -swelling, -difficulty breathing when lying flat, -waking up short of breath Respiratory: -cough, -shortness of breath, -difficulty breathing with exercise or exertion, -wheezing, -coughing up blood Gastroenterology: -abdominal pain, -nausea, -vomiting, -diarrhea, -constipation, -blood in stool, -changes in bowel movement, -difficulty swallowing or eating Hematology: -bleeding, -bruising  Musculoskeletal: -joint aches, -muscle aches, -joint swelling, -back pain, -neck pain, -cramping, -changes in gait Ophthalmology: denies vision changes, eye redness, itching, discharge Urology: -burning with urination, -difficulty urinating, -blood in urine, -urinary frequency, -urgency, +incontinence Neurology: -headache, -weakness, -tingling, -numbness, -memory loss, -falls, -dizziness Psychology: -depressed mood, -agitation, -sleep problems     Objective:   Physical Exam  Filed Vitals:   02/26/13 1015  BP: 92/70  Pulse: 60  Temp: 98.2 F (36.8 C)  Resp: 16    General appearance: alert, no distress, WD/WN, lean AA female Skin: no worrisome lesions HEENT: normocephalic, conjunctiva/corneas normal, sclerae anicteric, PERRLA, EOMi, nares patent,  no discharge or erythema, pharynx normal Oral cavity: MMM, tongue normal, teeth in good repair Neck: supple, no lymphadenopathy, no thyromegaly, no masses, normal ROM, no bruits Chest: non tender,  normal shape and expansion Heart: RRR, normal S1, S2, no murmurs Lungs: CTA bilaterally, no wheezes, rhonchi, or rales Abdomen: +bs, soft, non tender, non distended, no masses, no hepatomegaly, no splenomegaly, no bruits Back: non tender, normal ROM, no scoliosis Musculoskeletal: upper extremities non tender, no obvious deformity, normal ROM throughout, lower extremities non tender, no obvious deformity, normal ROM throughout Extremities: no edema, no cyanosis, no clubbing Pulses: 2+ symmetric, upper and lower extremities, normal cap refill Neurological: alert, oriented x 3, CN2-12 intact, strength normal upper extremities and lower extremities, sensation normal throughout, DTRs 2+ throughout, no cerebellar signs, gait normal Psychiatric: normal affect, behavior normal, pleasant  Breast: nontender, no masses or lumps, no skin changes, no nipple discharge or inversion, no axillary lymphadenopathy Gyn: Normal external genitalia without lesions, vagina with normal mucosa, cervix without lesions, no cervical motion tenderness, no abnormal vaginal discharge.  Uterus and adnexa not enlarged, nontender, no masses.  Pap performed.  Exam chaperoned by nurse. Rectal: deferred    Assessment and Plan :    Encounter Diagnoses  Name Primary?  . Routine general medical examination at a health care facility Yes  . Contraception management   . Screen for STD (sexually transmitted disease)   . Genital herpes   . Screening for cervical cancer     Physical exam - discussed healthy lifestyle, diet, exercise, preventative care, vaccinations, and addressed their concerns.  Handout given.  Contraception management - c/t same medication.   discussed risks/benefits of medication, options for therapy. Urine pregnancy negative.   Refills given.  STD screening today, discussed safe sex  Genital herpes - discussed means of transmission, prevention, treatment options  Pap smear today  Follow-up pending  labs

## 2013-03-01 ENCOUNTER — Encounter: Payer: Self-pay | Admitting: Family Medicine

## 2013-03-27 ENCOUNTER — Ambulatory Visit (INDEPENDENT_AMBULATORY_CARE_PROVIDER_SITE_OTHER): Payer: Medicaid Other | Admitting: Medical

## 2013-03-27 ENCOUNTER — Encounter: Payer: Self-pay | Admitting: Medical

## 2013-03-27 VITALS — BP 100/80 | HR 64 | Temp 98.3°F | Resp 16 | Wt 127.0 lb

## 2013-03-27 DIAGNOSIS — K439 Ventral hernia without obstruction or gangrene: Secondary | ICD-10-CM

## 2013-03-27 DIAGNOSIS — K5900 Constipation, unspecified: Secondary | ICD-10-CM

## 2013-03-27 NOTE — Progress Notes (Signed)
Subjective:  [name] is a 37 y.o. female who presents for abdominal c/o.   She reports over past few months, when she eats, gets full quickly, stays full for long periods of time.  Can go hours feeling full. She noticed that BMs have decreased in frequency, but different than prior daily BM.  Not going once every few days.   Has tried using Activia Yogurt which helps, but when she stops this, BMs are different.  Does get some bloating and gas.  Has had 2 prior C sections, and after second C section, was advised of small hernia.   Thinks this has gotten bigger over last few months.  When doing exercises at the gym gets protrusion of abdomen.  Yesterday protruded quite a bit.  She does report hx/o constipation with prior pregnancy, had taken colace during that time. She does feel that she is eating a healthy variety of foods and drinking plenty of water.  No other aggravating or relieving factors.    No other c/o.  The following portions of the patient's history were reviewed and updated as appropriate: allergies, current medications, past family history, past medical history, past social history, past surgical history and problem list.  Allergies  Allergen Reactions  . Cephalosporins Itching and Swelling  . Penicillins Itching, Swelling and Other (See Comments)    Patient states that she can't move.    Current Outpatient Prescriptions on File Prior to Visit  Medication Sig Dispense Refill  . Norethin Ace-Eth Estrad-FE (MINASTRIN 24 FE) 1-20 MG-MCG(24) CHEW Chew 1 tablet by mouth daily. 1 tablet po daily  28 tablet  11   No current facility-administered medications on file prior to visit.    Past Medical History  Diagnosis Date  . Herpes genitalis in women   . Contraceptive management   . Seasonal allergic rhinitis   . Sinusitis     history of, worse as teenager    Past Surgical History  Procedure Laterality Date  . Cesarean section      twice    Family History  Problem  Relation Age of Onset  . Hypertension Mother   . Heart disease Mother 10    MI, CHF, CABG  . Thyroid disease Mother   . Arthritis Father   . Diabetes Father   . Arthritis Paternal Grandmother   . Heart disease Paternal Grandmother   . Anemia Sister   . Cancer Maternal Grandmother     pancreatic    History   Social History  . Marital Status: Married    Spouse Name: N/A    Number of Children: N/A  . Years of Education: N/A   Occupational History  . Not on file.   Social History Main Topics  . Smoking status: Never Smoker   . Smokeless tobacco: Not on file  . Alcohol Use: No  . Drug Use: No  . Sexually Active: Not on file   Other Topics Concern  . 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  Have 2 children ages 34yo and 20yo.  Newly divorced.      Reviewed their medical, surgical, family, social, medication, and allergy history and updated chart as appropriate.   ROS Otherwise as in subjective above  Objective: Physical Exam  Vital signs reviewed  General appearance: alert, no distress, WD/WN Abdomen: +bs, soft, approx 5 cm superior to umbilicus  with round approx 3cm diameter reducible nontender ventral hernia more obvious standing with valsalva, but also appreciated supine, otherwise non tender, non distended, no masses, no hepatomegaly, no splenomegaly   Assessment: Encounter Diagnoses  Name Primary?  . Ventral hernia Yes  . Constipation    Plan: Ventral hernia - discussed diagnosis, handout given, will refer to general surgery for consult constipation - discussed diet and hydration, diagnosis, other treatment options.   Handout given.  Recheck if this continues to be a problem Follow up: pending referral

## 2013-03-27 NOTE — Patient Instructions (Signed)
Ventral Hernia A ventral hernia (also called an incisional hernia) is a hernia that occurs at the site of a previous surgical cut (incision) in the abdomen. The abdominal wall spans from your lower chest down to your pelvis. If the abdominal wall is weakened from a surgical incision, a hernia can occur. A hernia is a bulge of bowel or muscle tissue pushing out on the weakened part of the abdominal wall. Ventral hernias can get bigger from straining or lifting. Obese and older people are at higher risk for a ventral hernia. People who develop infections after surgery or require repeat incisions at the same site on the abdomen are also at increased risk. CAUSES  A ventral hernia occurs because of weakness in the abdominal wall at an incision site.  SYMPTOMS  Common symptoms include:  A visible bulge or lump on the abdominal wall.  Pain or tenderness around the lump.  Increased discomfort if you cough or make a sudden movement. If the hernia has blocked part of the intestine, a serious complication can occur (incarcerated or strangulated hernia). This can become a problem that requires emergency surgery because the blood flow to the blocked intestine may be cut off. Symptoms may include:  Feeling sick to your stomach (nauseous).  Throwing up (vomiting).  Stomach swelling (distention) or bloating.  Fever.  Rapid heartbeat. DIAGNOSIS  Your caregiver will take a medical history and perform a physical exam. Various tests may be ordered, such as:  Blood tests.  Urine tests.  Ultrasonography.  X-rays.  Computed tomography (CT). TREATMENT  Watchful waiting may be all that is needed for a smaller hernia that does not cause symptoms. Your caregiver may recommend the use of a supportive belt (truss) that helps to keep the abdominal wall intact. For larger hernias or those that cause pain, surgery to repair the hernia is usually recommended. If a hernia becomes strangulated, emergency surgery  needs to be done right away. HOME CARE INSTRUCTIONS  Avoid putting pressure or strain on the abdominal area.  Avoid heavy lifting.  Use good body positioning for physical tasks. Ask your caregiver about proper body positioning.  Use a supportive belt as directed by your caregiver.  Maintain a healthy weight.  Eat foods that are high in fiber, such as whole grains, fruits, and vegetables. Fiber helps prevent difficult bowel movements (constipation).  Drink enough fluids to keep your urine clear or pale yellow.  Follow up with your caregiver as directed. SEEK MEDICAL CARE IF:   Your hernia seems to be getting larger or more painful. SEEK IMMEDIATE MEDICAL CARE IF:   You have abdominal pain that is sudden and sharp.  Your pain becomes severe.  You have repeated vomiting.  You are sweating a lot.  You notice a rapid heartbeat.  You develop a fever. MAKE SURE YOU:   Understand these instructions.  Will watch your condition.  Will get help right away if you are not doing well or get worse. Document Released: 08/22/2012 Document Reviewed: 08/10/2012 [facility] Patient Information 2014 [place], [place].    Constipation, Adult Constipation is when a person has fewer than 3 bowel movements a week; has difficulty having a bowel movement; or has stools that are dry, hard, or larger than normal. As people grow older, constipation is more common. If you try to fix constipation with medicines that make you have a bowel movement (laxatives), the problem may get worse. Long-term laxative use may cause the muscles of the colon to become weak. A  low-fiber diet, not taking in enough fluids, and taking certain medicines may make constipation worse. CAUSES   Certain medicines, such as antidepressants, pain medicine, iron supplements, antacids, and water pills.   Certain diseases, such as diabetes, irritable bowel syndrome (IBS), thyroid disease, or depression.   Not drinking enough  water.   Not eating enough fiber-rich foods.   Stress or travel.  Lack of physical activity or exercise.  Not going to the restroom when there is the urge to have a bowel movement.  Ignoring the urge to have a bowel movement.  Using laxatives too much. SYMPTOMS   Having fewer than 3 bowel movements a week.   Straining to have a bowel movement.   Having hard, dry, or larger than normal stools.   Feeling full or bloated.   Pain in the lower abdomen.  Not feeling relief after having a bowel movement. DIAGNOSIS  Your caregiver will take a medical history and perform a physical exam. Further testing may be done for severe constipation. Some tests may include:   A barium enema X-ray to examine your rectum, colon, and sometimes, your small intestine.  A sigmoidoscopy to examine your lower colon.  A colonoscopy to examine your entire colon. TREATMENT  Treatment will depend on the severity of your constipation and what is causing it. Some dietary treatments include drinking more fluids and eating more fiber-rich foods. Lifestyle treatments may include regular exercise. If these diet and lifestyle recommendations do not help, your caregiver may recommend taking over-the-counter laxative medicines to help you have bowel movements. Prescription medicines may be prescribed if over-the-counter medicines do not work.  HOME CARE INSTRUCTIONS   Increase dietary fiber in your diet (25 grams daily), such as fruits, vegetables, whole grains, and beans. Limit high-fat and processed sugars in your diet, such as [place] fries, hamburgers, cookies, candies, and soda.   A fiber supplement may be added to your diet if you cannot get enough fiber from foods.   Drink enough fluids to keep your urine clear or pale yellow.   Exercise regularly or as directed by your caregiver.   Go to the restroom when you have the urge to go. Do not hold it.  Only take medicines as directed by your  caregiver. Do not take other medicines for constipation without talking to your caregiver first. SEEK IMMEDIATE MEDICAL CARE IF:   You have bright red blood in your stool.   Your constipation lasts for more than 4 days or gets worse.   You have abdominal or rectal pain.   You have thin, pencil-like stools.  You have unexplained weight loss. MAKE SURE YOU:   Understand these instructions.  Will watch your condition.  Will get help right away if you are not doing well or get worse. Document Released: 06/03/2004 Document Revised: 11/28/2011 Document Reviewed: 08/09/2011 [facility] Patient Information 2014 [place], [place].

## 2013-04-05 ENCOUNTER — Ambulatory Visit (INDEPENDENT_AMBULATORY_CARE_PROVIDER_SITE_OTHER): Payer: Medicaid Other | Admitting: General Surgery

## 2013-04-05 ENCOUNTER — Encounter (INDEPENDENT_AMBULATORY_CARE_PROVIDER_SITE_OTHER): Payer: Self-pay | Admitting: General Surgery

## 2013-04-05 VITALS — BP 126/68 | HR 64 | Temp 98.0°F | Resp 14 | Ht 62.0 in | Wt 127.0 lb

## 2013-04-05 DIAGNOSIS — K439 Ventral hernia without obstruction or gangrene: Secondary | ICD-10-CM

## 2013-04-05 NOTE — Progress Notes (Signed)
Subjective:   abdominal hernia  Patient ID: [name], female   DOB: 01/04/1976, 37 y.o.   MRN: [id number]  HPI Patient is a very nice 37 year old female referred by Dr. Susann Givens for an abdominal hernia. For about 2 years she has had a small bulge in the midline above the umbilicus. This has gradually increased in recent months and began to get  Uncomfortable.  No severe pain or nausea or vomiting. She does have some general abdominal bloating occasionally. She has never had any surgery at this site or previous repair.  Past Medical History  Diagnosis Date  . Herpes genitalis in women   . Contraceptive management   . Seasonal allergic rhinitis   . Sinusitis     history of, worse as teenager   Past Surgical History  Procedure Laterality Date  . Cesarean section      twice   .cmede Allergies  Allergen Reactions  . Cephalosporins Itching and Swelling  . Penicillins Itching, Swelling and Other (See Comments)    Patient states that she can't move.   History  Substance Use Topics  . Smoking status: Never Smoker   . Smokeless tobacco: Never Used  . Alcohol Use: No     Review of Systems  HENT: Negative.   Respiratory: Negative.   Cardiovascular: Negative.   Gastrointestinal: Positive for constipation and abdominal distention.       Objective:   Physical Exam BP 126/68  Pulse 64  Temp(Src) 98 F (36.7 C) (Temporal)  Resp 14  Ht 5\' 2"  (1.575 m)  Wt 127 lb (57.607 kg)  BMI 23.22 kg/m2 General: Alert, well-developed [place] American female, in no distress Skin: Warm and dry without rash or infection. HEENT: No palpable masses or thyromegaly. Sclera nonicteric. Pupils equal round and reactive. Oropharynx clear. Lungs: Breath sounds clear and equal without increased work of breathing Cardiovascular: Regular rate and rhythm without murmur. No JVD or edema. Peripheral pulses intact. Abdomen: Nondistended. Soft and nontender. No masses palpable. There is a fairly small, 1-2  cm epigastric hernia in the midline which is reducible and appears to be coming through a small defect. Extremities: No edema or joint swelling or deformity. No chronic venous stasis changes. Neurologic: Alert and fully oriented. Gait normal.    Assessment:     Symptomatic enlarging epigastric hernia. I recommended repair under general anesthesia with an open technique using a hernia patch. We discussed the indications for the surgery and risks including anesthetic complications, bleeding, infection and recurrence. She was given literature regarding the procedure. All her questions were answered.   Plan:     Open repair of epigastric hernia with mesh under general anesthesia as an outpatient

## 2013-04-09 DIAGNOSIS — K439 Ventral hernia without obstruction or gangrene: Secondary | ICD-10-CM

## 2013-04-25 ENCOUNTER — Encounter (INDEPENDENT_AMBULATORY_CARE_PROVIDER_SITE_OTHER): Payer: Self-pay | Admitting: General Surgery

## 2013-04-25 ENCOUNTER — Ambulatory Visit (INDEPENDENT_AMBULATORY_CARE_PROVIDER_SITE_OTHER): Payer: Medicaid Other | Admitting: General Surgery

## 2013-04-25 VITALS — BP 118/66 | HR 64 | Temp 98.1°F | Resp 14 | Ht 62.0 in | Wt 125.6 lb

## 2013-04-25 DIAGNOSIS — Z09 Encounter for follow-up examination after completed treatment for conditions other than malignant neoplasm: Secondary | ICD-10-CM

## 2013-04-25 NOTE — Progress Notes (Signed)
History: Patient returns for followup after repair of her small epigastric hernia. She reports no problems at this point. No pain in her preoperative symptoms of been relieved.  Exam: Appears well. Her incision is well-healed. Soft and nontender and no evidence of early recurrence or other complication.  Assessment and plan: Doing well following repair of epigastric hernia. She is released return to full activities. Return as needed.

## 2013-06-21 ENCOUNTER — Ambulatory Visit (INDEPENDENT_AMBULATORY_CARE_PROVIDER_SITE_OTHER): Payer: Medicaid Other | Admitting: Medical

## 2013-06-21 ENCOUNTER — Encounter: Payer: Self-pay | Admitting: Medical

## 2013-06-21 VITALS — BP 90/60 | HR 67 | Temp 97.9°F | Resp 16 | Wt 125.0 lb

## 2013-06-21 DIAGNOSIS — R140 Abdominal distension (gaseous): Secondary | ICD-10-CM

## 2013-06-21 DIAGNOSIS — Z23 Encounter for immunization: Secondary | ICD-10-CM | POA: Diagnosis not present

## 2013-06-21 DIAGNOSIS — D649 Anemia, unspecified: Secondary | ICD-10-CM | POA: Diagnosis not present

## 2013-06-21 DIAGNOSIS — K5900 Constipation, unspecified: Secondary | ICD-10-CM

## 2013-06-21 DIAGNOSIS — R141 Gas pain: Secondary | ICD-10-CM

## 2013-06-21 DIAGNOSIS — K5909 Other constipation: Secondary | ICD-10-CM

## 2013-06-21 DIAGNOSIS — R142 Eructation: Secondary | ICD-10-CM | POA: Diagnosis not present

## 2013-06-21 MED ORDER — LUBIPROSTONE 24 MCG PO CAPS: 24.0000 ug | ORAL_CAPSULE | Freq: Two times a day (BID) | ORAL | Status: DC

## 2013-06-21 NOTE — Patient Instructions (Signed)
Begin Amitiza twice daily for 2 days (green box), then go to twice daily to help with bowel movements.  Continue to get lots of water and fiber in diet. Consider supplement such as flaxseed, chia seed, or psyllium daily in diet as a fiber source.  We will call with lab results.

## 2013-06-21 NOTE — Progress Notes (Signed)
Subjective:  [name] is a 37 y.o. female who presents for recheck. Here for recheck on anemia, found on last labs. She denies history of anemia, denies any blood  loss or bruising, periods are not heavy.  She does note ongoing problems with constipation, has a BM 2-3 times a week, has tried increasing fiber and water intake, but nothing seems to help.  She also seems to have problems with bread, causes a lot of gas and bloating.  She stays gassy in general.  No other aggravating or relieving factors.    No other c/o.  The following portions of the patient's history were reviewed and updated as appropriate: allergies, current medications, past family history, past medical history, past social history, past surgical history and problem list.  ROS Otherwise as in subjective above  Objective: Physical Exam  Vital signs reviewed  General appearance: alert, no distress, WD/WN Neck: supple, no lymphadenopathy, no thyromegaly, no masses Heart: RRR, normal S1, S2, no murmurs Lungs: CTA bilaterally, no wheezes, rhonchi, or rales Abdomen: +bs, soft, non tender, non distended, no masses, no hepatomegaly, no splenomegaly Pulses: 2+ radial pulses, 2+ pedal pulses, normal cap refill   Assessment: Encounter Diagnoses  Name Primary?  Marland Kitchen Anemia Yes  . Bloating   . Chronic constipation   . Need for prophylactic vaccination and inoculation against influenza      Plan: Anemia-mild on last labs at her physical.  She notes no current bleeding, periods are not heavy, no prior history of anemia, eats a variety of foods.  If the labs come back abnormal, may have her do stool studies or other eval  Bloating and chronic constipation-continued fiber and water intake, begin samples of  Amitiza, and lab profiles today for celiac disease and food allergy panel  Counseled on the influenza virus vaccine.  Vaccine information sheet given.  Influenza vaccine given after consent obtained.  Follow up:  pending labs

## 2013-06-22 LAB — CBC
HCT: 38.7 % (ref 36.0–46.0)
Hemoglobin: 13.2 g/dL (ref 12.0–15.0)
MCH: 29.7 pg (ref 26.0–34.0)
MCHC: 34.1 g/dL (ref 30.0–36.0)
MCV: 87.2 fL (ref 78.0–100.0)
Platelets: 328 10*3/uL (ref 150–400)
RBC: 4.44 MIL/uL (ref 3.87–5.11)
RDW: 14.5 % (ref 11.5–15.5)
WBC: 5.8 10*3/uL (ref 4.0–10.5)

## 2013-06-24 LAB — ALLERGEN FOOD PROFILE SPECIFIC IGE
Apple: <0.1 kU/L
Chicken IgE: <0.1 kU/L
Corn: <0.1 kU/L
Egg White IgE: <0.1 kU/L
Fish Cod: <0.1 kU/L
IgE (Immunoglobulin E), Serum: 47 IU/mL (ref 0.0–180.0)
Milk IgE: <0.1 kU/L
Orange: <0.1 kU/L
Peanut IgE: <0.1 kU/L
Shrimp IgE: <0.1 kU/L
Soybean IgE: <0.1 kU/L
Tomato IgE: <0.1 kU/L
Tuna IgE: <0.1 kU/L
Wheat IgE: <0.1 kU/L

## 2013-06-24 LAB — GLIA (IGA/G) + TTG IGA
Gliadin IgA: 4.7 U/mL (ref <20)
Gliadin IgG: 4.9 U/mL (ref <20)
Tissue Transglutaminase Ab, IgA: 5.1 U/mL (ref <20)

## 2013-07-25 ENCOUNTER — Telehealth: Payer: Self-pay | Admitting: Medical

## 2013-07-26 NOTE — Telephone Encounter (Signed)
Patient is aware that she will need to schedule a OV. CLS

## 2013-07-26 NOTE — Telephone Encounter (Signed)
I don't think I've treated her for this before.  What are symptoms?  For how long?  What has she tried?  What does she think caused her to have this?

## 2013-08-28 ENCOUNTER — Telehealth: Payer: Self-pay | Admitting: Medical

## 2013-08-28 NOTE — Telephone Encounter (Signed)
Needs appt, possibly UA for starters

## 2013-08-28 NOTE — Telephone Encounter (Signed)
I left the patient a voicemail. CLS 

## 2013-08-28 NOTE — Telephone Encounter (Signed)
   Please call Patient states you treated her a few months ago for bacterial vaginal infection and she would like refill on med (does not know name)  Same symptons as last time,,,,,odor in urine    [facility]  In [place]  (off [address])

## 2013-08-29 ENCOUNTER — Ambulatory Visit (INDEPENDENT_AMBULATORY_CARE_PROVIDER_SITE_OTHER): Payer: Managed Care, Other (non HMO) | Admitting: Family Medicine

## 2013-08-29 ENCOUNTER — Encounter: Payer: Self-pay | Admitting: Family Medicine

## 2013-08-29 VITALS — BP 100/60 | HR 72 | Temp 98.1°F | Ht 62.0 in | Wt 130.0 lb

## 2013-08-29 DIAGNOSIS — R82998 Other abnormal findings in urine: Secondary | ICD-10-CM

## 2013-08-29 DIAGNOSIS — N390 Urinary tract infection, site not specified: Secondary | ICD-10-CM

## 2013-08-29 DIAGNOSIS — R8290 Unspecified abnormal findings in urine: Secondary | ICD-10-CM

## 2013-08-29 LAB — POCT URINALYSIS DIPSTICK
Bilirubin, UA: NEGATIVE
Glucose, UA: NEGATIVE
Ketones, UA: NEGATIVE
Nitrite, UA: POSITIVE
Protein, UA: NEGATIVE
Spec Grav, UA: 1.01
Urobilinogen, UA: NEGATIVE
pH, UA: 6

## 2013-08-29 MED ORDER — SULFAMETHOXAZOLE-TMP DS 800-160 MG PO TABS: 1.0000 | ORAL_TABLET | Freq: Two times a day (BID) | ORAL | Status: DC

## 2013-08-29 NOTE — Patient Instructions (Signed)
Drink plenty of fluids. Take the antibiotic twice daily for at least 3 days.  If you are 100% better at 3 days, okay to stop the medicine (especially if any side effects), otherwise, continue for the full 5 days or until we call you with your culture results.

## 2013-08-29 NOTE — Progress Notes (Signed)
Chief Complaint  Patient presents with  . Advice Only    noticed last Frday or Saturday that her urine had a strong odor. Worse if she drinks something other than water.    She noticed a strong odor to her urine about 5-6 days ago, which persists.  It is less when she drinks a lot of water, otherwise continues to be strong.  She is having some mild urinary urgency and frequency in the last couple of weeks.  Urine has also looked cloudy.  Denies any OTC vitamins or supplements, no new foods or asparagus.  LMP 12/3, last bit of spotting was noticed 2-3 days ago, no spotting in the last 2 days.  She does have some mild chronic discharge, unchanged from her usual--no itching, odor or change in color.  She has had BV in the past, thinks that the odor is somewhat similar.  Past Medical History  Diagnosis Date  . Herpes genitalis in women   . Contraceptive management   . Seasonal allergic rhinitis   . Sinusitis     history of, worse as teenager   Past Surgical History  Procedure Laterality Date  . Cesarean section      twice  . Hernia repair     History   Social History  . Marital Status: Single    Spouse Name: N/A    Number of Children: 2  . Years of Education: N/A   Occupational History  . territory sales Barista)    Social History Main Topics  . Smoking status: Never Smoker   . Smokeless tobacco: Never Used  . Alcohol Use: Yes     Comment: occasionally  . Drug Use: No  . Sexual Activity: Not Currently    Birth Control/ Protection: Condom   Other Topics Concern  . 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  Have 2 children ages 20yo and 55yo.  Newly divorced.      Current outpatient prescriptions:lubiprostone (AMITIZA) 24 MCG capsule, Take 1 capsule (24 mcg total) by mouth 2 (two) times daily with a meal., Disp: 60 capsule, Rfl: 2;  Norethin Ace-Eth Estrad-FE (MINASTRIN 24 FE) 1-20 MG-MCG(24) CHEW, Chew 1 tablet by  mouth daily. 1 tablet po daily, Disp: 28 tablet, Rfl: 11  Allergies  Allergen Reactions  . Cephalosporins Itching and Swelling  . Penicillins Itching, Swelling and Other (See Comments)    Patient states that she can't move.   ROS:  Denies fevers, chills, nausea, vomiting, diarrhea, flank pain, rash, bleeding, bruising.  +discharge as per HPI. Denies headaches, chest pain, palpitations, abdominal pain or other concerns.  Recent URI symptoms, with minimal residual cough.  PHYSICAL EXAM: BP 100/60  Pulse 72  Temp(Src) 98.1 F (36.7 C) (Oral)  Ht 5\' 2"  (1.575 m)  Wt 130 lb (58.968 kg)  BMI 23.77 kg/m2  LMP 08/21/2013 Well developed, pleasant female in no distress Neck: no lymphadenopathy Back: no CVA tenderness Heart: regular rate and rhythm, no murmur Lungs: clear bilaterally Abdomen: soft, nontender, no mass  Urine dip:  1+ blood, 1+ leuks, +nitrite  ASSESSMENT/PLAN:  Urine malodor - Plan: POCT Urinalysis Dipstick  Urinary tract infection, site not specified - Plan: sulfamethoxazole-trimethoprim (BACTRIM DS) 800-160 MG per tablet, Urine culture   Send urine for culture. Treat presumptively while culture is pending, given that she is having some urinary urgency and frequency also. If culture is negative, and odor persists, consider treatment for BV given  her history.

## 2014-02-05 ENCOUNTER — Encounter: Payer: Self-pay | Admitting: Medical

## 2014-02-05 ENCOUNTER — Ambulatory Visit (INDEPENDENT_AMBULATORY_CARE_PROVIDER_SITE_OTHER): Payer: Managed Care, Other (non HMO) | Admitting: Medical

## 2014-02-05 VITALS — BP 100/60 | HR 60 | Temp 98.4°F | Resp 16 | Wt 130.0 lb

## 2014-02-05 DIAGNOSIS — K120 Recurrent oral aphthae: Secondary | ICD-10-CM

## 2014-02-05 DIAGNOSIS — H1045 Other chronic allergic conjunctivitis: Secondary | ICD-10-CM

## 2014-02-05 DIAGNOSIS — J309 Allergic rhinitis, unspecified: Secondary | ICD-10-CM

## 2014-02-05 DIAGNOSIS — H1010 Acute atopic conjunctivitis, unspecified eye: Secondary | ICD-10-CM

## 2014-02-05 MED ORDER — PATADAY 0.2 % OP SOLN: 1.0000 [drp] | Freq: Two times a day (BID) | OPHTHALMIC | Status: DC

## 2014-02-05 MED ORDER — CETIRIZINE HCL 10 MG PO TABS: 10.0000 mg | ORAL_TABLET | Freq: Every day | ORAL | Status: DC

## 2014-02-05 MED ORDER — TRIAMCINOLONE ACETONIDE 0.1 % MT PSTE: 1.0000 "application " | PASTE | Freq: Two times a day (BID) | OROMUCOSAL | Status: DC

## 2014-02-05 NOTE — Progress Notes (Signed)
   Subjective:   Donella StadeDeidra R Brown is a 38 y.o. female presenting on 02/05/2014 with BUMP ON THE INSIDE OF LIP AND PAINFUL  Here for allergies, runny nose, sneezing, itchy and watery eyes.   Using sudafed without relief.   Has new painful white bump inside left upper lip x 3 days.  No prior similar.  No other aggravating or relieving factors.  No other complaint.  Review of Systems ROS as in subjective      Objective:    Filed Vitals:   02/05/14 1534  BP: 100/60  Pulse: 60  Temp: 98.4 F (36.9 C)  Resp: 16    General appearance: alert, no distress, WD/WN HEENT: normocephalic, sclerae anicteric, TMs pearly, nares patent with clear discharge mild turbinated edema, pharynx normal Oral cavity: MMM, left upper lip with 2mm white painful flat lesion, no other lesions Neck: supple, no lymphadenopathy, no thyromegaly, no masses Lungs: CTA bilaterally, no wheezes, rhonchi, or rales      Assessment: Encounter Diagnoses  Name Primary?  . Aphthous ulcer of mouth Yes  . Allergic rhinitis   . Allergic conjunctivitis      Plan: Aphthous ulcer - discussed diagnosis, advised salt water gargles, triamcinoline in orabase topically  Allergies - begin zyrtec and pataday  [name] was seen today for bump on the inside of lip and painful.  Diagnoses and associated orders for this visit:  Aphthous ulcer of mouth  Allergic rhinitis  Allergic conjunctivitis  Other Orders - triamcinolone (KENALOG) 0.1 % paste; Use as directed 1 application in the mouth or throat 2 (two) times daily. - PATADAY 0.2 % SOLN; Apply 1 drop to eye 2 (two) times daily. - cetirizine (ZYRTEC ALLERGY) 10 MG tablet; Take 1 tablet (10 mg total) by mouth daily.    Return if symptoms worsen or fail to improve.

## 2014-03-04 ENCOUNTER — Other Ambulatory Visit: Payer: Self-pay | Admitting: Medical

## 2014-03-13 ENCOUNTER — Other Ambulatory Visit: Payer: Self-pay | Admitting: Medical

## 2014-03-14 NOTE — Telephone Encounter (Signed)
NEEDS TO SCHEDULE A PHYSICAL ASAP BEFORE YOUR MEDICATIONS RUNS OUT

## 2014-03-19 ENCOUNTER — Encounter: Payer: Self-pay | Admitting: Medical

## 2014-03-19 ENCOUNTER — Ambulatory Visit (INDEPENDENT_AMBULATORY_CARE_PROVIDER_SITE_OTHER): Payer: Managed Care, Other (non HMO) | Admitting: Medical

## 2014-03-19 VITALS — BP 98/60 | HR 62 | Temp 98.3°F | Resp 14 | Wt 131.0 lb

## 2014-03-19 DIAGNOSIS — L732 Hidradenitis suppurativa: Secondary | ICD-10-CM

## 2014-03-19 MED ORDER — SULFAMETHOXAZOLE-TMP DS 800-160 MG PO TABS: 1.0000 | ORAL_TABLET | Freq: Two times a day (BID) | ORAL | Status: DC

## 2014-03-19 NOTE — Progress Notes (Signed)
Subjective:  [name] is a 38 y.o. female who presents for evaluation of a possible skin infection.  Lesion is located in the bilateral axilla.  Has had 1 lump in right axillar for a year or so that hasn't seemed to change but in the last 5 days has had more lumps, 1 on the right draining some pus.  Symptoms include mild pain.  Symptoms have gradually worsened. Patient denies fever, chills, aches.  Treatment to date has included none.  Patient does have previous history of cellulitis or cutaneous abscesses.   Patient denies hx/o MRSA.  Patient does have hx/o I&D for similar.  Patient does not have diabetes.  Patient denies hx/o poor wound healing, compromised immunity or HIV.  No other aggravating or relieving factors.  No other c/o.  Past Medical History  Diagnosis Date  . Herpes genitalis in women   . Contraceptive management   . Seasonal allergic rhinitis   . Sinusitis     history of, worse as teenager    Reviewed prior allergies, medications, past medical history, past surgical history.  ROS as in subjective     Objective: Filed Vitals:   03/19/14 1545  BP: 98/60  Pulse: 62  Temp: 98.3 F (36.8 C)  Resp: 14    General appearance: alert, no distress, WD/WN, female  Right axilla with 3 small superficial 3-204mm diameter nodules suggestive of hidradenitis suppurative lesions, some tender, similar 3 smaller lesions left axilla   Assessment: Encounter Diagnosis  Name Primary?  . Hidradenitis axillaris Yes    Plan: Discussed symptoms, examination findings, diagnosis, usual course of illness, and options for therapy discussed.  Begin Bactirm, warm compresses, discard current razors and deodorant and start new after these lesions resolve.   If worse signs of infections as discussed (fever, chills, nausea, vomiting, worsening redness, worsening pain), then call or return immediately.

## 2014-03-19 NOTE — Patient Instructions (Signed)
Hidradenitis Suppurative  Begin Bactrim antibiotic twice daily for 10 days  Use hot moist compresses in the armpits the next 10 days  Throw out the current deodorant and razor  Once this is cleared up, then use new deodorant and new razors  Keep area clean with soap and water  If not improving let me know

## 2014-04-15 ENCOUNTER — Other Ambulatory Visit: Payer: Self-pay | Admitting: Medical

## 2014-04-27 ENCOUNTER — Other Ambulatory Visit: Payer: Self-pay | Admitting: Medical

## 2014-05-20 ENCOUNTER — Other Ambulatory Visit: Payer: Self-pay | Admitting: Medical

## 2014-05-22 ENCOUNTER — Other Ambulatory Visit: Payer: Self-pay | Admitting: Medical

## 2014-05-22 NOTE — Telephone Encounter (Signed)
IS THIS OKAY 

## 2014-05-27 ENCOUNTER — Telehealth: Payer: Self-pay | Admitting: Family Medicine

## 2014-05-27 NOTE — Telephone Encounter (Signed)
Ok, and you called out the medication correct?

## 2014-05-27 NOTE — Telephone Encounter (Signed)
Patient has her physical appointment on 06/23/14. CLS

## 2014-05-27 NOTE — Telephone Encounter (Signed)
Message copied by Janeice Robinson on Tue May 27, 2014  9:47 AM ------      Message from: Jac Canavan      Created: Tue May 27, 2014  8:07 AM       Make sure you got msg on her about OCP and CPX.  I may have closed out by accident ------

## 2014-05-27 NOTE — Telephone Encounter (Signed)
Yes, I sent in for a 30 day on her BCP. CLS

## 2014-05-27 NOTE — Telephone Encounter (Signed)
Needs yearly physical visit for contraception, pap, etc.  If running out completely of OCPs, can refill x 30days

## 2014-06-18 ENCOUNTER — Other Ambulatory Visit: Payer: Self-pay | Admitting: Medical

## 2014-06-23 ENCOUNTER — Other Ambulatory Visit (HOSPITAL_COMMUNITY)
Admission: RE | Admit: 2014-06-23 | Discharge: 2014-06-23 | Disposition: A | Discharge status: Home / Self Care | Payer: Managed Care, Other (non HMO) | Source: Ambulatory Visit | Attending: Medical | Admitting: Medical

## 2014-06-23 ENCOUNTER — Ambulatory Visit (INDEPENDENT_AMBULATORY_CARE_PROVIDER_SITE_OTHER): Payer: Managed Care, Other (non HMO) | Admitting: Medical

## 2014-06-23 ENCOUNTER — Encounter: Payer: Self-pay | Admitting: Medical

## 2014-06-23 VITALS — BP 98/60 | HR 60 | Temp 98.2°F | Resp 16 | Ht 61.5 in | Wt 130.0 lb

## 2014-06-23 DIAGNOSIS — Z1151 Encounter for screening for human papillomavirus (HPV): Secondary | ICD-10-CM | POA: Insufficient documentation

## 2014-06-23 DIAGNOSIS — Z23 Encounter for immunization: Secondary | ICD-10-CM

## 2014-06-23 DIAGNOSIS — Z0000 Encounter for general adult medical examination without abnormal findings: Secondary | ICD-10-CM

## 2014-06-23 DIAGNOSIS — R4189 Other symptoms and signs involving cognitive functions and awareness: Secondary | ICD-10-CM

## 2014-06-23 DIAGNOSIS — R4689 Other symptoms and signs involving appearance and behavior: Secondary | ICD-10-CM

## 2014-06-23 DIAGNOSIS — F919 Conduct disorder, unspecified: Secondary | ICD-10-CM

## 2014-06-23 DIAGNOSIS — L989 Disorder of the skin and subcutaneous tissue, unspecified: Secondary | ICD-10-CM

## 2014-06-23 DIAGNOSIS — Z01419 Encounter for gynecological examination (general) (routine) without abnormal findings: Secondary | ICD-10-CM | POA: Insufficient documentation

## 2014-06-23 DIAGNOSIS — Z124 Encounter for screening for malignant neoplasm of cervix: Secondary | ICD-10-CM

## 2014-06-23 DIAGNOSIS — Z3041 Encounter for surveillance of contraceptive pills: Secondary | ICD-10-CM

## 2014-06-23 LAB — POCT URINALYSIS DIPSTICK
Bilirubin, UA: NEGATIVE
GLUCOSE UA: NEGATIVE
Ketones, UA: NEGATIVE
Leukocytes, UA: NEGATIVE
Nitrite, UA: NEGATIVE
PH UA: 7
Protein, UA: NEGATIVE
RBC UA: NEGATIVE
Spec Grav, UA: <=1.005
UROBILINOGEN UA: NEGATIVE

## 2014-06-23 MED ORDER — NORETHIN ACE-ETH ESTRAD-FE 1-20 MG-MCG(24) PO CHEW: 1.0000 | CHEWABLE_TABLET | Freq: Every day | ORAL | Status: DC

## 2014-06-23 MED ORDER — OLOPATADINE HCL 0.2 % OP SOLN: 1.0000 [drp] | Freq: Two times a day (BID) | OPHTHALMIC | Status: DC

## 2014-06-23 NOTE — Progress Notes (Addendum)
Subjective:   HPI  [name] is a 38 y.o. female who presents for a complete physical.   Preventative care: Last ophthalmology visit:YES Last dental visit:YES [place] SMILES Last colonoscopy:N/A Last mammogram:N/A Last gynecological exam:06/23/14 Last EKG:N/A Last labs: 2014  Prior vaccinations: TD or Tdap:2010 Influenza:06/23/14 Pneumococcal:N/A Shingles/Zostavax:N/A  Advanced directive:N/A Health care power of attorney:N/A Living will:N/A  Concerns: She reports still having lumps under both armpits.  Not painful, no changes, no smaller or bigger, no drainage.    Feels like her memory seems off since having kids.   Works in Airline [employer]. If she doesn't write things down, forgets things, remembers them latera when too late.  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   Been in current position for over a year.    Here to renew OCPs, no c/o, no new sexual partners since last STD screening.  No concerns.  Hx/o genital herpes, only 2-3 flares ever, none in the last year.  Declines any medication for this.   Reviewed their medical, surgical, family, social, medication, and allergy history and updated chart as appropriate.  Past Medical History  Diagnosis Date  . Herpes genitalis in women   . Contraceptive management   . Seasonal allergic rhinitis   . Sinusitis     history of, worse as teenager  . Wears glasses     Past Surgical History  Procedure Laterality Date  . Cesarean section      twice  . Hernia repair  2014    umbilical    History   Social History  . Marital Status: Single    Spouse Name: N/A    Number of Children: 2  . Years of Education: N/A   Occupational History  . territory sales Barista)    Social History Main Topics  . Smoking status: Never Smoker   . Smokeless tobacco: Never Used  . Alcohol Use: Yes     Comment: occasionally  . Drug Use: No  . Sexual Activity: Not Currently    Birth Control/ Protection: Condom   Other Topics Concern  . Not on file    Social History Narrative   Works--territory sales; exercise - runs some.  Have 2 children ages 78yo and 67yo, both girls.  Divorced, no significant other as of 06/2014.    Family History  Problem Relation Age of Onset  . Hypertension Mother   . Heart disease Mother 26    MI, CHF, CABG  . Thyroid disease Mother   . Arthritis Father   . Diabetes Father   . Arthritis Paternal Grandmother   . Heart disease Paternal Grandmother   . Anemia Sister   . Depression Sister   . Cancer Maternal Grandmother     pancreatic    Current outpatient prescriptions:cetirizine (ZYRTEC) 10 MG tablet, TAKE 1 TABLET (10 MG TOTAL) BY MOUTH DAILY., Disp: 30 tablet, Rfl: 0;  Norethin Ace-Eth Estrad-FE (MINASTRIN 24 FE) 1-20 MG-MCG(24) CHEW, Chew 1 tablet by mouth daily., Disp: 28 tablet, Rfl: 11;  Olopatadine HCl (PATADAY) 0.2 % SOLN, Place 1 drop into both eyes 2 (two) times daily., Disp: 2.5 mL, Rfl: 11  Allergies  Allergen Reactions  . Cephalosporins Itching and Swelling  . Penicillins Itching, Swelling and Other (See Comments)    Patient states that she can't move.    Review of Systems Constitutional: -fever, -chills, -sweats, -unexpected weight change, -decreased appetite, -fatigue Allergy: -sneezing, -itching, -congestion Dermatology: -changing moles, --rash, _+lumps(UNDER ARM) ENT: -runny nose, -ear pain, -sore throat, -hoarseness, -sinus pain, -teeth pain, -  ringing in ears, -hearing loss, -nosebleeds Cardiology: -chest pain, -palpitations, -swelling, -difficulty breathing when lying flat, -waking up short of breath Respiratory: -cough, -shortness of breath, -difficulty breathing with exercise or exertion, -wheezing, -coughing up blood Gastroenterology: -abdominal pain, -nausea, -vomiting, -diarrhea, -constipation, -blood in stool, -changes in bowel movement, -difficulty swallowing or eating Hematology: -bleeding, -bruising  Musculoskeletal: -joint aches, -muscle aches, -joint swelling, -back  pain, -neck pain, -cramping, -changes in gait Ophthalmology: denies vision changes, eye redness, itching, discharge Urology: -burning with urination, -difficulty urinating, -blood in urine, -urinary frequency, -urgency, -incontinence Neurology: -headache, -weakness, -tingling, -numbness, -memory loss, -falls, -dizziness Psychology: -depressed mood, -agitation, -sleep problems     Objective:   Physical Exam  BP 98/60  Pulse 60  Temp(Src) 98.2 F (36.8 C) (Oral)  Resp 16  Ht 5' 1.5" (1.562 m)  Wt 130 lb (58.968 kg)  BMI 24.17 kg/m2  LMP 06/22/2014  General appearance: alert, no distress, WD/WN, lean AA female Skin: few scattered benign appearing macules, bilat axilla with small 3-4 mm diameter superficial oval to round nodules suggestive of benign cysts, no worrisome lesions HEENT: normocephalic, conjunctiva/corneas normal, sclerae anicteric, PERRLA, EOMi, nares patent, no discharge or erythema, pharynx normal Oral cavity: MMM, tongue normal, teeth in good repair Neck: supple, no lymphadenopathy, no thyromegaly, no masses, normal ROM, no bruits Chest: non tender, normal shape and expansion Heart: RRR, normal S1, S2, no murmurs Lungs: CTA bilaterally, no wheezes, rhonchi, or rales Abdomen: +bs, soft, umbilical surgical scar, otherwise non tender, non distended, no masses, no hepatomegaly, no splenomegaly, no bruits Back: non tender, normal ROM, no scoliosis Musculoskeletal: upper extremities non tender, no obvious deformity, normal ROM throughout, lower extremities non tender, no obvious deformity, normal ROM throughout Extremities: no edema, no cyanosis, no clubbing Pulses: 2+ symmetric, upper and lower extremities, normal cap refill Neurological: alert, oriented x 3, CN2-12 intact, strength normal upper extremities and lower extremities, sensation normal throughout, DTRs 2+ throughout, no cerebellar signs, gait normal Psychiatric: normal affect, behavior normal, pleasant  Breast:  nontender, no masses or lumps, no skin changes, no nipple discharge or inversion, no axillary lymphadenopathy Gyn: Normal external genitalia without lesions, vagina with normal mucosa, cervix without lesions, no cervical motion tenderness, no abnormal vaginal discharge.  Uterus and adnexa not enlarged, nontender, no masses.  Pap performed.  Exam chaperoned by nurse. Rectal: deferred    Assessment and Plan :    Encounter Diagnoses  Name Primary?  . Encounter for health maintenance examination in adult Yes  . Need for prophylactic vaccination and inoculation against influenza   . Cognitive and behavioral changes   . Skin lesions   . Screening for cervical cancer   . Encounter for surveillance of contraceptive pills     Physical exam - discussed healthy lifestyle, diet, exercise, preventative care, vaccinations, and addressed their concerns.  Handout given.  Counseled on the influenza virus vaccine.  Vaccine information sheet given.  Influenza vaccine given after consent obtained.  Cognitive changes - no obvious abnormality, likely just related to busy schedule in sales, high demands of the job, being single parent and having 2 kids with activities they do regularly   Skin lesions - reassured.  Sebaceous cyst vs axillary sweat glands with some debris, but no infection, nontender.   Dr. Susann GivensLalonde examined as well.  Pap sent  Contraception management - discussed risks/benefits of medication, doing fine on medication, discussed safe sex, prevention  Follow-up pending labs

## 2014-06-24 LAB — COMPREHENSIVE METABOLIC PANEL
ALT: 9 U/L (ref 0–35)
AST: 15 U/L (ref 0–37)
Albumin: 4.3 g/dL (ref 3.5–5.2)
Alkaline Phosphatase: 54 U/L (ref 39–117)
BUN: 11 mg/dL (ref 6–23)
CO2: 28 mEq/L (ref 19–32)
Calcium: 9 mg/dL (ref 8.4–10.5)
Chloride: 104 mEq/L (ref 96–112)
Creat: 0.69 mg/dL (ref 0.50–1.10)
Glucose, Bld: 72 mg/dL (ref 70–99)
Potassium: 3.9 mEq/L (ref 3.5–5.3)
Sodium: 139 mEq/L (ref 135–145)
Total Bilirubin: 0.5 mg/dL (ref 0.2–1.2)
Total Protein: 6.8 g/dL (ref 6.0–8.3)

## 2014-06-24 LAB — CBC WITH DIFFERENTIAL/PLATELET
BASOS PCT: 0 % (ref 0–1)
Basophils Absolute: 0 10*3/uL (ref 0.0–0.1)
EOS ABS: 0.3 10*3/uL (ref 0.0–0.7)
Eosinophils Relative: 5 % (ref 0–5)
HEMATOCRIT: 37.5 % (ref 36.0–46.0)
HEMOGLOBIN: 12.7 g/dL (ref 12.0–15.0)
Lymphocytes Relative: 40 % (ref 12–46)
Lymphs Abs: 2.5 10*3/uL (ref 0.7–4.0)
MCH: 29.7 pg (ref 26.0–34.0)
MCHC: 33.9 g/dL (ref 30.0–36.0)
MCV: 87.6 fL (ref 78.0–100.0)
MONO ABS: 0.3 10*3/uL (ref 0.1–1.0)
Monocytes Relative: 4 % (ref 3–12)
Neutro Abs: 3.2 10*3/uL (ref 1.7–7.7)
Neutrophils Relative %: 51 % (ref 43–77)
Platelets: 270 10*3/uL (ref 150–400)
RBC: 4.28 MIL/uL (ref 3.87–5.11)
RDW: 14.2 % (ref 11.5–15.5)
WBC: 6.3 10*3/uL (ref 4.0–10.5)

## 2014-06-24 LAB — CYTOLOGY - PAP

## 2014-06-24 LAB — LIPID PANEL
Cholesterol: 183 mg/dL (ref 0–200)
HDL: 71 mg/dL (ref >39)
LDL Cholesterol: 101 mg/dL — ABNORMAL HIGH (ref 0–99)
TRIGLYCERIDES: 55 mg/dL (ref <150)
Total CHOL/HDL Ratio: 2.6 Ratio
VLDL: 11 mg/dL (ref 0–40)

## 2014-06-24 LAB — VITAMIN B12: VITAMIN B 12: 566 pg/mL (ref 211–911)

## 2014-06-24 LAB — VITAMIN D 25 HYDROXY (VIT D DEFICIENCY, FRACTURES): Vit D, 25-Hydroxy: 36 ng/mL (ref 30–89)

## 2014-06-24 LAB — TSH: TSH: 1.247 u[IU]/mL (ref 0.350–4.500)

## 2014-06-25 ENCOUNTER — Encounter: Payer: Self-pay | Admitting: Family Medicine

## 2014-09-10 ENCOUNTER — Telehealth: Payer: Self-pay | Admitting: Medical

## 2014-09-10 ENCOUNTER — Ambulatory Visit (INDEPENDENT_AMBULATORY_CARE_PROVIDER_SITE_OTHER): Payer: Medicaid Other | Admitting: Medical

## 2014-09-10 ENCOUNTER — Encounter: Payer: Self-pay | Admitting: Medical

## 2014-09-10 VITALS — BP 92/70 | HR 68 | Temp 98.2°F | Resp 16 | Wt 131.0 lb

## 2014-09-10 DIAGNOSIS — K5900 Constipation, unspecified: Secondary | ICD-10-CM | POA: Diagnosis not present

## 2014-09-10 DIAGNOSIS — R591 Generalized enlarged lymph nodes: Secondary | ICD-10-CM | POA: Diagnosis not present

## 2014-09-10 DIAGNOSIS — L0591 Pilonidal cyst without abscess: Secondary | ICD-10-CM | POA: Diagnosis not present

## 2014-09-10 MED ORDER — HYDROCODONE-ACETAMINOPHEN 7.5-325 MG PO TABS: 1.0000 | ORAL_TABLET | Freq: Four times a day (QID) | ORAL | Status: DC | PRN

## 2014-09-10 MED ORDER — SULFAMETHOXAZOLE-TRIMETHOPRIM 800-160 MG PO TABS: 1.0000 | ORAL_TABLET | Freq: Two times a day (BID) | ORAL | Status: DC

## 2014-09-10 NOTE — Progress Notes (Signed)
Subjective: Here for several day hx/o painful area at upper butt crack where she has had abscess 2 prior times in the last few years, both times that did ok with I&D and antibiotic here.  She denies fever, drainage at the area, but he area feels big and painful.   She also has swollen area of left inguinal region, big tender knot.   She denies vaginal symptoms urinary symptoms, no concern for STD, and the area in the inguinal region started same time as the buttock tenderness.  She also still complains of chronic constipation history of numerous things in the past, still only  Having 1 or 2 bowel movements a week  ROS as in subjective  Past Medical History  Diagnosis Date  . Herpes genitalis in women   . Contraceptive management   . Seasonal allergic rhinitis   . Sinusitis     history of, worse as teenager  . Wears glasses    Past Surgical History  Procedure Laterality Date  . Cesarean section      twice  . Hernia repair  2014    umbilical    Objective: Gen: wd, wn, nad Skin: upper region of gluteal cleft with 1cm raised tender area of induration with 2 prior small surgical scars, there is small central drainage hole in the center of the gluteal cleft in same region, no obvious erythema and possible area of fluctuance Left inguinal tender enlarged lymph node   Assessment: Encounter Diagnoses  Name Primary?  . Pilonidal cyst Yes  . Lymphadenopathy   . 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  Once prior lanced by Dr. Susann GivensLalonde here, last time by me over a year ago.  We attempted to incise the prior surgical scar, but no pus was appreciated.  There was some slight pus drainage from the center of the gluteal cleft away from incision.   We decided not to proceed.  Begin bactrim, hydrocodone prn for pain, warm soaks, referral to general surgery for consult.   Procedure -  Cleaned and prepped area in usual  sterile fashion with 1% lidocaine with epinephrine,  i made a small incision with scalpel at the site of the previous incision for the same issue upper buttock gluteal cleft.   Slight pus was expressed from the central pore in the gluteal cleft that was are present,but there was no obvious abscess or pus at the incision site.  Cleaned area, apple steri strip loosely over the small incision site.  Patient tolerated procedure well, <2cc blood loss.   Lymphadenopathy likely related to the infected cyst, begin OTC ibuprofen  constipation - referral to pharmquest study.  In the meantime can use colace OTC prn, miralax prn, c/t fiber supplement, good water intake

## 2014-09-10 NOTE — Telephone Encounter (Signed)
Refer to [employer] for constipation study.

## 2014-09-10 NOTE — Telephone Encounter (Signed)
[facility]erral to [facility] for pilonidal cyst

## 2014-09-10 NOTE — Telephone Encounter (Signed)
[facility] and I also left a message on the voicemail to the new patient coordinator Lamar LaundrySonya about this patient and the referral

## 2014-09-11 NOTE — Telephone Encounter (Signed)
[facility] called asking for more numbers and Olegario MessierKathy gave emergency contact number for them to get in touch with pt. Pt has an appt on monday

## 2014-09-15 ENCOUNTER — Other Ambulatory Visit: Payer: Self-pay | Admitting: Family Medicine

## 2014-09-15 DIAGNOSIS — L0591 Pilonidal cyst without abscess: Secondary | ICD-10-CM

## 2014-09-15 NOTE — Telephone Encounter (Signed)
I fax over the paper work to [employer] per [employer]

## 2014-10-08 ENCOUNTER — Telehealth: Payer: Self-pay | Admitting: Medical

## 2014-10-08 NOTE — Telephone Encounter (Signed)
Received request for special accommodations due to allergy problems.  Please find out how long of periods she needs on the form, days, weeks?  List if seems reasonable (1-2 weeks, etc), and fax back.  Keep us a copy.

## 2014-10-09 NOTE — Telephone Encounter (Signed)
This was already handled. Tammy worked on this and then [employer]kathy faxed the form back and put original in box for her to pick up

## 2014-10-13 ENCOUNTER — Other Ambulatory Visit: Payer: Self-pay | Admitting: Medical

## 2014-10-17 ENCOUNTER — Encounter: Payer: Self-pay | Admitting: Medical

## 2014-10-17 ENCOUNTER — Ambulatory Visit (INDEPENDENT_AMBULATORY_CARE_PROVIDER_SITE_OTHER): Payer: Managed Care, Other (non HMO) | Admitting: Medical

## 2014-10-17 VITALS — BP 92/60 | HR 68 | Temp 98.5°F | Resp 16 | Wt 130.0 lb

## 2014-10-17 DIAGNOSIS — J3089 Other allergic rhinitis: Secondary | ICD-10-CM

## 2014-10-17 DIAGNOSIS — A6000 Herpesviral infection of urogenital system, unspecified: Secondary | ICD-10-CM

## 2014-10-17 DIAGNOSIS — Z113 Encounter for screening for infections with a predominantly sexual mode of transmission: Secondary | ICD-10-CM

## 2014-10-17 NOTE — Progress Notes (Signed)
Subjective: Here for concern for STD screening.  Has new partner, had 1 partner since prior testing in 2014.   No current symptoms, no vaginal discharge, no urinary c/o, no abdominal pain, no back pain.  On OCPs without c/o.  LMP 2 days ago finished.  Periods are still regular. She meant to do STD screening at physical in October but declined at that time.  She called in recently while out of town, requested note for special setting at work training classes due to room mate and classmate with strong odor and strong perfumes.   She notes that strong odors really aggravate her sinuses and allergies.   She requested we send note which I did.  Goes up to [place] for 1 week every 4 weeks through early summer possibly, so wants to have notation of being allowed separate setting away from strong odors if she is around this.       Objective: BP 92/60 mmHg  Pulse 68  Temp(Src) 98.5 F (36.9 C) (Oral)  Resp 16  Wt 130 lb (58.968 kg)  Gen: WDWN, nad Otherwise not examined  Assessment: Encounter Diagnoses  Name Primary?  . Other allergic rhinitis Yes  . Genital herpes   . Screen for STD (sexually transmitted disease)     Plan: Allergic rhinitis -note already done for special setting at work.  Avoid triggers. C/t antihistamine  Discussed prevention, safe sex, routine labs today

## 2014-10-18 LAB — HIV ANTIBODY (ROUTINE TESTING W REFLEX): HIV 1&2 Ab, 4th Generation: NONREACTIVE

## 2014-10-18 LAB — GC/CHLAMYDIA PROBE AMP
CT PROBE, AMP APTIMA: NEGATIVE
GC Probe RNA: NEGATIVE

## 2014-10-18 LAB — RPR

## 2014-11-09 ENCOUNTER — Other Ambulatory Visit: Payer: Self-pay | Admitting: Medical

## 2015-03-20 ENCOUNTER — Encounter: Payer: Self-pay | Admitting: Family Medicine

## 2015-03-20 ENCOUNTER — Ambulatory Visit (INDEPENDENT_AMBULATORY_CARE_PROVIDER_SITE_OTHER): Payer: Managed Care, Other (non HMO) | Admitting: Family Medicine

## 2015-03-20 VITALS — BP 116/70 | HR 76 | Ht 62.0 in | Wt 133.0 lb

## 2015-03-20 DIAGNOSIS — L723 Sebaceous cyst: Secondary | ICD-10-CM | POA: Diagnosis not present

## 2015-03-20 DIAGNOSIS — L089 Local infection of the skin and subcutaneous tissue, unspecified: Secondary | ICD-10-CM

## 2015-03-20 MED ORDER — DOXYCYCLINE HYCLATE 100 MG PO TABS: 100.0000 mg | ORAL_TABLET | Freq: Two times a day (BID) | ORAL | Status: DC

## 2015-03-20 NOTE — Progress Notes (Signed)
Chief Complaint  Patient presents with  . Mass    under right arm x days-painful. Woke up and it was there.     She woke up Wednesday, and while dressing/bathing, she noticed a bump in her right axillary area.  It hurts to touch, and with arm movements.  It has gotten a little bigger over the last few days.  She tried warm compresses, didn't notice any change. No known drainage, fevers or chills.  Shaves with disposable razors. Her kids are in [place] with their dad for another week.  PMH, PSH, SH reviewed.  Outpatient Encounter Prescriptions as of 03/20/2015  Medication Sig  . cetirizine (ZYRTEC) 10 MG tablet TAKE 1 TABLET (10 MG TOTAL) BY MOUTH DAILY.  Marland Kitchen. Norethin Ace-Eth Estrad-FE (MINASTRIN 24 FE) 1-20 MG-MCG(24) CHEW Chew 1 tablet by mouth daily.  . Olopatadine HCl (PATADAY) 0.2 % SOLN Place 1 drop into both eyes 2 (two) times daily. (Patient not taking: Reported on 03/20/2015)   No facility-administered encounter medications on file as of 03/20/2015.   Allergies  Allergen Reactions  . Cephalosporins Itching and Swelling  . Penicillins Itching, Swelling and Other (See Comments)    Patient states that she can't move.   ROS: Denies fevers, chills, nausea, vomiting, URI symptoms, rashes, headaches, dizziness, chest pain, palpitations, bleeding, bruising or other concerns.  PHYSICAL EXAM: BP 116/70 mmHg  Pulse 76  Ht 5\' 2"  (1.575 m)  Wt 133 lb (60.328 kg)  BMI 24.32 kg/m2  LMP 03/06/2015  Well developed, pleasant female in no distress Right axilla--there a 2 very small, superficial sebaceous cysts, nontender, in the axilla.  Just anterior to these, close to the chest wall, along the lateral aspect of the pectoralis muscle, there is a 2x1cm firm mass, with central pore noted. Very mild erythema overlying.  No axillary lymphadenopathy. No fluctuance.  ASSESSMENT/PLAN:  Infected sebaceous cyst of skin - Plan: doxycycline (VIBRA-TABS) 100 MG tablet  EIC/sebaceous cyst, with early  infection. Treat with warm compresses and doxycycline. Return next week for I&D if increasing in size and pain, not ready for this today.

## 2015-03-20 NOTE — Patient Instructions (Signed)
Epidermal Cyst An epidermal cyst is sometimes called a sebaceous cyst, epidermal inclusion cyst, or infundibular cyst. These cysts usually contain a substance that looks "pasty" or "cheesy" and may have a bad smell. This substance is a protein called keratin. Epidermal cysts are usually found on the face, neck, or trunk. They may also occur in the vaginal area or other parts of the genitalia of both men and women. Epidermal cysts are usually small, painless, slow-growing bumps or lumps that move freely under the skin. It is important not to try to pop them. This may cause an infection and lead to tenderness and swelling. CAUSES  Epidermal cysts may be caused by a deep penetrating injury to the skin or a plugged hair follicle, often associated with acne. SYMPTOMS  Epidermal cysts can become inflamed and cause:  Redness.  Tenderness.  Increased temperature of the skin over the bumps or lumps.  Grayish-white, bad smelling material that drains from the bump or lump. DIAGNOSIS  Epidermal cysts are easily diagnosed by your caregiver during an exam. Rarely, a tissue sample (biopsy) may be taken to rule out other conditions that may resemble epidermal cysts. TREATMENT   Epidermal cysts often get better and disappear on their own. They are rarely ever cancerous.  If a cyst becomes infected, it may become inflamed and tender. This may require opening and draining the cyst. Treatment with antibiotics may be necessary. When the infection is gone, the cyst may be removed with minor surgery.  Small, inflamed cysts can often be treated with antibiotics or by injecting steroid medicines.  Sometimes, epidermal cysts become large and bothersome. If this happens, surgical removal in your caregiver's office may be necessary. HOME CARE INSTRUCTIONS  Only take over-the-counter or prescription medicines as directed by your caregiver.  Take your antibiotics as directed. Finish them even if you start to feel  better. SEEK MEDICAL CARE IF:   Your cyst becomes tender, red, or swollen.  Your condition is not improving or is getting worse.  You have any other questions or concerns. MAKE SURE YOU:  Understand these instructions.  Will watch your condition.  Will get help right away if you are not doing well or get worse. Document Released: 08/06/2004 Document Revised: 11/28/2011 Document Reviewed: 03/14/2011 [facility] Patient Information 2015 [employer] [place]. This information is not intended to replace advice given to you by your health care provider. Make sure you discuss any questions you have with your health care provider.   Return next week (or to urgent care over the weekend) if significantly worse, and needs drainage. Change the razor. Let the hair grow longer. In future, try not to shave quite as close. Go back to your usual deoderant

## 2015-04-26 ENCOUNTER — Other Ambulatory Visit: Payer: Self-pay | Admitting: Medical

## 2015-06-24 ENCOUNTER — Other Ambulatory Visit: Payer: Self-pay | Admitting: Medical

## 2015-07-07 ENCOUNTER — Telehealth: Payer: Self-pay | Admitting: Medical

## 2015-07-07 ENCOUNTER — Encounter: Payer: Self-pay | Admitting: Medical

## 2015-07-07 DIAGNOSIS — Z0000 Encounter for general adult medical examination without abnormal findings: Secondary | ICD-10-CM

## 2015-07-07 NOTE — Telephone Encounter (Signed)
pls bill for missed physical visit and send no show letter

## 2015-07-10 ENCOUNTER — Encounter: Payer: Self-pay | Admitting: Medical

## 2015-07-10 NOTE — Telephone Encounter (Signed)
No show letter with fee accessed mailed °

## 2015-07-17 ENCOUNTER — Other Ambulatory Visit: Payer: Self-pay | Admitting: Medical

## 2015-09-25 ENCOUNTER — Ambulatory Visit (INDEPENDENT_AMBULATORY_CARE_PROVIDER_SITE_OTHER): Payer: Managed Care, Other (non HMO) | Admitting: Medical

## 2015-09-25 ENCOUNTER — Encounter: Payer: Self-pay | Admitting: Medical

## 2015-09-25 VITALS — BP 118/70 | Ht 62.5 in | Wt 136.0 lb

## 2015-09-25 DIAGNOSIS — R5382 Chronic fatigue, unspecified: Secondary | ICD-10-CM | POA: Diagnosis not present

## 2015-09-25 DIAGNOSIS — Z0000 Encounter for general adult medical examination without abnormal findings: Secondary | ICD-10-CM | POA: Diagnosis not present

## 2015-09-25 DIAGNOSIS — Z23 Encounter for immunization: Secondary | ICD-10-CM | POA: Diagnosis not present

## 2015-09-25 DIAGNOSIS — Z8489 Family history of other specified conditions: Secondary | ICD-10-CM | POA: Diagnosis not present

## 2015-09-25 DIAGNOSIS — Z113 Encounter for screening for infections with a predominantly sexual mode of transmission: Secondary | ICD-10-CM | POA: Diagnosis not present

## 2015-09-25 DIAGNOSIS — Z820 Family history of epilepsy and other diseases of the nervous system: Secondary | ICD-10-CM

## 2015-09-25 DIAGNOSIS — Z3040 Encounter for surveillance of contraceptives, unspecified: Secondary | ICD-10-CM | POA: Diagnosis not present

## 2015-09-25 LAB — POCT URINALYSIS DIPSTICK
Bilirubin, UA: NEGATIVE
Blood, UA: NEGATIVE
GLUCOSE UA: NEGATIVE
KETONES UA: NEGATIVE
Leukocytes, UA: NEGATIVE
Nitrite, UA: NEGATIVE
Protein, UA: NEGATIVE
SPEC GRAV UA: 1.025
UROBILINOGEN UA: NEGATIVE
pH, UA: 6

## 2015-09-25 LAB — CBC
HEMATOCRIT: 36.8 % (ref 36.0–46.0)
Hemoglobin: 12.6 g/dL (ref 12.0–15.0)
MCH: 29.9 pg (ref 26.0–34.0)
MCHC: 34.2 g/dL (ref 30.0–36.0)
MCV: 87.2 fL (ref 78.0–100.0)
MPV: 8.8 fL (ref 8.6–12.4)
PLATELETS: 306 10*3/uL (ref 150–400)
RBC: 4.22 MIL/uL (ref 3.87–5.11)
RDW: 15.1 % (ref 11.5–15.5)
WBC: 7.8 10*3/uL (ref 4.0–10.5)

## 2015-09-25 LAB — BASIC METABOLIC PANEL
BUN: 11 mg/dL (ref 7–25)
CALCIUM: 9.2 mg/dL (ref 8.6–10.2)
CO2: 27 mmol/L (ref 20–31)
Chloride: 103 mmol/L (ref 98–110)
Creat: 0.71 mg/dL (ref 0.50–1.10)
Glucose, Bld: 86 mg/dL (ref 65–99)
Potassium: 4.3 mmol/L (ref 3.5–5.3)
Sodium: 138 mmol/L (ref 135–146)

## 2015-09-25 LAB — LIPID PANEL
CHOL/HDL RATIO: 2.1 ratio (ref <=5.0)
Cholesterol: 187 mg/dL (ref 125–200)
HDL: 90 mg/dL (ref >=46)
LDL Cholesterol: 89 mg/dL (ref <130)
Triglycerides: 42 mg/dL (ref <150)
VLDL: 8 mg/dL (ref <30)

## 2015-09-25 LAB — POCT URINE PREGNANCY: Preg Test, Ur: NEGATIVE

## 2015-09-25 MED ORDER — NORETHIN ACE-ETH ESTRAD-FE 1-20 MG-MCG(24) PO CHEW: CHEWABLE_TABLET | ORAL | Status: DC

## 2015-09-25 NOTE — Progress Notes (Signed)
Subjective:   HPI  [name] is a 40 y.o. female who presents for a complete physical.  Concerns: Awakes sleepy all the time. Taking a multivitamin.  Snores some with sinuses are acting up.  No witnessed apnea. Not dozing off in the daytime, but does take occasional daytime nap.  Usually gets to sleep ok.  No routine joint swelling or pains.   Does awake in the night, gets up to urinate about once nightly.   Does stress sometimes about work.   Mother has sleep apnea.    Here to renew OCPs, no c/o, no new sexual partners since last STD screening.  No concerns.   Both kids are in travel volleyball.  Reviewed their medical, surgical, family, social, medication, and allergy history and updated chart as appropriate.  Past Medical History  Diagnosis Date  . Herpes genitalis in women   . Contraceptive management   . Seasonal allergic rhinitis   . Sinusitis     history of, worse as teenager  . Wears glasses     Past Surgical History  Procedure Laterality Date  . Cesarean section      twice  . Hernia repair  2014    umbilical    Social History   Social History  . Marital Status: Single    Spouse Name: N/A  . Number of Children: 2  . Years of Education: N/A   Occupational History  . 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 Smoking status: Never Smoker   . Smokeless tobacco: Never Used  . Alcohol Use: Yes     Comment: occasionally  . Drug Use: No  . Sexual Activity: Not Currently    Birth Control/ Protection: Condom   Other Topics Concern  . Not on file   Social History Narrative   Works for [employer], outside [employer]; exercise - not much in 2016, has been a runner in the past.  Have 2 children ages 708yo and 39yo, both girls.  Divorced, no significant other as of 09/2015    Family History  Problem Relation Age of Onset  . Hypertension Mother   . Heart disease Mother 2750    MI, CHF, CABG  . Thyroid disease Mother   . Arthritis Father   . Diabetes  Father   . Arthritis Paternal Grandmother   . Heart disease Paternal Grandmother   . Anemia Sister   . Depression Sister   . Cancer Maternal Grandmother     pancreatic     Current outpatient prescriptions:  .  cetirizine (ZYRTEC) 10 MG tablet, TAKE 1 TABLET (10 MG TOTAL) BY MOUTH DAILY. (Patient not taking: Reported on 09/25/2015), Disp: 30 tablet, Rfl: 0 .  Norethin Ace-Eth Estrad-FE (MINASTRIN 24 FE) 1-20 MG-MCG(24) CHEW, CHEW 1 TABLET BY MOUTH DAILY, Disp: 28 tablet, Rfl: 11 .  Olopatadine HCl (PATADAY) 0.2 % SOLN, Place 1 drop into both eyes 2 (two) times daily. (Patient not taking: Reported on 03/20/2015), Disp: 2.5 mL, Rfl: 11  Allergies  Allergen Reactions  . Cephalosporins Itching and Swelling  . Penicillins Itching, Swelling and Other (See Comments)    Patient states that she can't move.    Review of Systems Constitutional: -fever, -chills, -sweats, -unexpected weight change, -decreased appetite, -fatigue Allergy: -sneezing, -itching, -congestion Dermatology: -changing moles, --rash, _+lumps(UNDER ARM) ENT: -runny nose, -ear pain, -sore throat, -hoarseness, -sinus pain, -teeth pain, - ringing in ears, -hearing loss, -nosebleeds Cardiology: -chest pain, -palpitations, -swelling, -difficulty breathing when lying flat, -  waking up short of breath Respiratory: -cough, -shortness of breath, -difficulty breathing with exercise or exertion, -wheezing, -coughing up blood Gastroenterology: -abdominal pain, -nausea, -vomiting, -diarrhea, -constipation, -blood in stool, -changes in bowel movement, -difficulty swallowing or eating Hematology: -bleeding, -bruising  Musculoskeletal: -joint aches, -muscle aches, -joint swelling, -back pain, -neck pain, -cramping, -changes in gait Ophthalmology: denies vision changes, eye redness, itching, discharge Urology: -burning with urination, -difficulty urinating, -blood in urine, -urinary frequency, -urgency, -incontinence Neurology: -headache,  -weakness, -tingling, -numbness, -memory loss, -falls, -dizziness Psychology: -depressed mood, -agitation, -sleep problems     Objective:   Physical Exam  BP 118/70 mmHg  Ht 5' 2.5" (1.588 m)  Wt 136 lb (61.689 kg)  BMI 24.46 kg/m2  LMP 09/11/2015  General appearance: alert, no distress, WD/WN, lean AA female Skin: few scattered benign appearing macules, no worrisome lesions HEENT: normocephalic, conjunctiva/corneas normal, sclerae anicteric, PERRLA, EOMi, nares patent, no discharge or erythema, pharynx normal Oral cavity: MMM, tongue normal, teeth in good repair Neck: supple, no lymphadenopathy, no thyromegaly, no masses, normal ROM, no bruits Chest: non tender, normal shape and expansion Heart: RRR, normal S1, S2, no murmurs Lungs: CTA bilaterally, no wheezes, rhonchi, or rales Abdomen: +bs, soft, umbilical surgical scar, otherwise non tender, non distended, no masses, no hepatomegaly, no splenomegaly, no bruits Back: non tender, normal ROM, no scoliosis Musculoskeletal: upper extremities non tender, no obvious deformity, normal ROM throughout, lower extremities non tender, no obvious deformity, normal ROM throughout Extremities: no edema, no cyanosis, no clubbing Pulses: 2+ symmetric, upper and lower extremities, normal cap refill Neurological: alert, oriented x 3, CN2-12 intact, strength normal upper extremities and lower extremities, sensation normal throughout, DTRs 2+ throughout, no cerebellar signs, gait normal Psychiatric: normal affect, behavior normal, pleasant  Breast: nontender, no masses or lumps, no skin changes, no nipple discharge or inversion, no axillary lymphadenopathy Gyn: Normal external genitalia without lesions, vagina with normal mucosa, cervix without lesions, no cervical motion tenderness, no abnormal vaginal discharge.  Uterus and adnexa not enlarged, nontender, no masses.  Exam chaperoned by nurse. Rectal: deferred    Assessment and Plan :    Encounter  Diagnoses  Name Primary?  . Encounter for health maintenance examination in adult Yes  . Encounter for surveillance of contraceptives   . Chronic fatigue   . Family history of sleep apnea   . Screen for STD (sexually transmitted disease)   . Need for prophylactic vaccination and inoculation against influenza     Physical exam - discussed healthy lifestyle, diet, exercise, preventative care, vaccinations, and addressed their concerns.  Handout given. Fatigue - labs today, discussed possibly doing sleep study, need to start exercising.   Epworth sleep score of 5. See your eye doctor yearly for routine vision care. See your dentist yearly for routine dental care including hygiene visits twice yearly. Counseled on the influenza virus vaccine.  Vaccine information sheet given.  Influenza vaccine given after consent obtained. Contraception management - discussed risks/benefits of medication,did fine on this medication prior.  Discussed safe sex, prevention.  Restart OCPs. Follow-up pending labs

## 2015-09-26 LAB — TSH: TSH: 0.999 u[IU]/mL (ref 0.350–4.500)

## 2015-09-26 LAB — RPR

## 2015-09-26 LAB — HIV ANTIBODY (ROUTINE TESTING W REFLEX): HIV: NONREACTIVE

## 2015-09-28 LAB — GC/CHLAMYDIA PROBE AMP
CT PROBE, AMP APTIMA: NOT DETECTED
GC PROBE AMP APTIMA: NOT DETECTED

## 2015-09-28 LAB — ANA: Anti Nuclear Antibody(ANA): NEGATIVE

## 2015-09-29 ENCOUNTER — Telehealth: Payer: Self-pay | Admitting: Medical

## 2015-09-29 NOTE — Telephone Encounter (Signed)
Pt requesting 30 day supply of Norethin Ace-Eth Estrad FE (must be generic form, insurance is not covering name brand) [facility] then [facility]

## 2015-09-30 ENCOUNTER — Other Ambulatory Visit: Payer: Self-pay | Admitting: Medical

## 2015-09-30 MED ORDER — NORETHIN ACE-ETH ESTRAD-FE 1-20 MG-MCG(24) PO CHEW: CHEWABLE_TABLET | ORAL | Status: DC

## 2015-10-28 ENCOUNTER — Telehealth: Payer: Self-pay | Admitting: Medical

## 2015-10-28 MED ORDER — CETIRIZINE HCL 10 MG PO TABS: ORAL_TABLET | ORAL | Status: DC

## 2015-10-28 NOTE — Telephone Encounter (Signed)
refilled 

## 2015-10-28 NOTE — Telephone Encounter (Signed)
Rcvd refill request (with note stating that there is a temp network outage with electronic refill submission) for Cetirizine  #30

## 2015-10-29 ENCOUNTER — Other Ambulatory Visit: Payer: Self-pay | Admitting: Medical

## 2015-11-30 ENCOUNTER — Other Ambulatory Visit: Payer: Self-pay | Admitting: Medical

## 2015-11-30 ENCOUNTER — Telehealth: Payer: Self-pay | Admitting: Medical

## 2015-11-30 MED ORDER — NORETHIN ACE-ETH ESTRAD-FE 1-20 MG-MCG(24) PO TABS: 1.0000 | ORAL_TABLET | Freq: Every day | ORAL | Status: DC

## 2015-11-30 NOTE — Telephone Encounter (Signed)
I sent alternative OCP to pharmacy. Let me know if cost issue or problem with the medication.   Her letter showed other covered options including  Apri  Aviane  Azurette  [place] [address]  Gianvi  Loestrine

## 2015-11-30 NOTE — Telephone Encounter (Signed)
Pt states that the bc med she was given her ins will not pay for. She emailed a list of the ones it will cover. I am sending the list back. Please call pt and let her know.

## 2015-12-01 NOTE — Telephone Encounter (Signed)
Left on pts personal voicemail 

## 2015-12-02 ENCOUNTER — Other Ambulatory Visit: Payer: Self-pay | Admitting: Medical

## 2015-12-08 ENCOUNTER — Other Ambulatory Visit: Payer: Self-pay | Admitting: Medical

## 2016-04-29 ENCOUNTER — Telehealth: Payer: Self-pay | Admitting: Medical

## 2016-04-29 ENCOUNTER — Encounter: Payer: Self-pay | Admitting: Medical

## 2016-04-29 DIAGNOSIS — Z1239 Encounter for other screening for malignant neoplasm of breast: Secondary | ICD-10-CM

## 2016-04-29 NOTE — Telephone Encounter (Signed)
V

## 2016-04-29 NOTE — Telephone Encounter (Signed)
Pt has orders to have labs done for her up coming [facility] by Dr Concepcion LivingMalcom Marks. She would like to come here to have lab work. She needs CMP, PT-PTT, CBC. Is this ok? She was also asked to go get a mammogram. Can Vincenza HewsShane help her get scheduled for this?

## 2016-04-29 NOTE — Telephone Encounter (Signed)
She can come in for labs.    Give her info to set up screening mammogram

## 2016-04-29 NOTE — Telephone Encounter (Signed)
Previous message was an error. Pt is aware and is calling to set up Mammogram

## 2016-05-05 ENCOUNTER — Other Ambulatory Visit: Payer: Managed Care, Other (non HMO)

## 2016-05-05 DIAGNOSIS — Z01818 Encounter for other preprocedural examination: Secondary | ICD-10-CM

## 2016-05-05 LAB — COMPREHENSIVE METABOLIC PANEL
ALK PHOS: 42 U/L (ref 33–115)
ALT: 17 U/L (ref 6–29)
AST: 16 U/L (ref 10–30)
Albumin: 4 g/dL (ref 3.6–5.1)
BUN: 8 mg/dL (ref 7–25)
CO2: 22 mmol/L (ref 20–31)
Calcium: 9.2 mg/dL (ref 8.6–10.2)
Chloride: 106 mmol/L (ref 98–110)
Creat: 0.66 mg/dL (ref 0.50–1.10)
GLUCOSE: 85 mg/dL (ref 65–99)
POTASSIUM: 4.4 mmol/L (ref 3.5–5.3)
Sodium: 139 mmol/L (ref 135–146)
Total Bilirubin: 0.4 mg/dL (ref 0.2–1.2)
Total Protein: 7 g/dL (ref 6.1–8.1)

## 2016-05-05 LAB — CBC WITH DIFFERENTIAL/PLATELET
BASOS ABS: 63 {cells}/uL (ref 0–200)
BASOS PCT: 1 %
EOS ABS: 189 {cells}/uL (ref 15–500)
Eosinophils Relative: 3 %
HEMATOCRIT: 38.3 % (ref 35.0–45.0)
Hemoglobin: 12.7 g/dL (ref 11.7–15.5)
LYMPHS PCT: 39 %
Lymphs Abs: 2457 cells/uL (ref 850–3900)
MCH: 29.3 pg (ref 27.0–33.0)
MCHC: 33.2 g/dL (ref 32.0–36.0)
MCV: 88.2 fL (ref 80.0–100.0)
MONO ABS: 315 {cells}/uL (ref 200–950)
MONOS PCT: 5 %
MPV: 9.1 fL (ref 7.5–12.5)
Neutro Abs: 3276 cells/uL (ref 1500–7800)
Neutrophils Relative %: 52 %
PLATELETS: 291 10*3/uL (ref 140–400)
RBC: 4.34 MIL/uL (ref 3.80–5.10)
RDW: 14.2 % (ref 11.0–15.0)
WBC: 6.3 10*3/uL (ref 4.0–10.5)

## 2016-05-06 LAB — CP4508-PT/INR AND PTT
INR: 0.9
Prothrombin Time: 9.9 s (ref 9.0–11.5)
aPTT: 26 s (ref 22–34)

## 2016-05-06 LAB — CBC WITH 3 PART DIFFERENTIAL
Basophils Absolute: 63 cells/uL (ref 0–200)
Basophils Relative: 1 %
EOS PCT: 3 %
Eosinophils Absolute: 189 cells/uL (ref 15–500)
HEMATOCRIT: 38.3 % (ref 35.0–45.0)
HEMOGLOBIN: 12.7 g/dL (ref 11.7–15.5)
LYMPHS ABS: 2457 {cells}/uL (ref 850–3900)
LYMPHS PCT: 39 %
MCH: 29.3 pg (ref 27.0–33.0)
MCHC: 33.2 g/dL (ref 32.0–36.0)
MCV: 88.2 fL (ref 80.0–100.0)
MONO ABS: 315 {cells}/uL (ref 200–950)
MONOS PCT: 5 %
MPV: 9.1 fL (ref 7.5–12.5)
NEUTROS ABS: 3339 {cells}/uL (ref 1500–7800)
Neutrophils Relative %: 53 %
Platelets: 291 10*3/uL (ref 140–400)
RBC: 4.34 MIL/uL (ref 3.80–5.10)
RDW: 14.2 % (ref 11.0–15.0)
WBC: 6.3 10*3/uL (ref 4.0–10.5)

## 2016-05-07 ENCOUNTER — Other Ambulatory Visit: Payer: Self-pay | Admitting: Medical

## 2016-05-12 ENCOUNTER — Other Ambulatory Visit: Payer: Self-pay | Admitting: Medical

## 2016-05-12 ENCOUNTER — Ambulatory Visit: Admission: RE | Admit: 2016-05-12 | Payer: Managed Care, Other (non HMO) | Source: Ambulatory Visit

## 2016-05-12 ENCOUNTER — Ambulatory Visit
Admission: RE | Admit: 2016-05-12 | Discharge: 2016-05-12 | Disposition: A | Discharge status: Home / Self Care | Payer: Managed Care, Other (non HMO) | Source: Ambulatory Visit | Attending: Medical | Admitting: Medical

## 2016-05-12 DIAGNOSIS — Z1231 Encounter for screening mammogram for malignant neoplasm of breast: Secondary | ICD-10-CM

## 2016-06-16 ENCOUNTER — Telehealth: Payer: Self-pay

## 2016-06-16 NOTE — Telephone Encounter (Signed)
[facility].

## 2016-06-17 MED ORDER — CETIRIZINE HCL 10 MG PO TABS: ORAL_TABLET | ORAL | 1 refills | Status: DC

## 2016-06-17 NOTE — Telephone Encounter (Signed)
done

## 2016-06-19 HISTORY — PX: COSMETIC SURGERY: SHX468

## 2016-06-20 ENCOUNTER — Telehealth: Payer: Self-pay

## 2016-06-20 MED ORDER — CETIRIZINE HCL 10 MG PO TABS: ORAL_TABLET | ORAL | 0 refills | Status: DC

## 2016-06-20 NOTE — Telephone Encounter (Signed)
Pt would like 90 day supply of [facility]izine [facility] mg [facility]. Trixie Rude/RLB

## 2016-06-20 NOTE — Telephone Encounter (Signed)
done

## 2016-07-09 HISTORY — PX: BREAST ENHANCEMENT SURGERY: SHX7

## 2016-08-30 ENCOUNTER — Encounter: Payer: Self-pay | Admitting: Medical

## 2016-08-30 ENCOUNTER — Ambulatory Visit (INDEPENDENT_AMBULATORY_CARE_PROVIDER_SITE_OTHER): Payer: Managed Care, Other (non HMO) | Admitting: Medical

## 2016-08-30 ENCOUNTER — Other Ambulatory Visit (HOSPITAL_COMMUNITY)
Admission: RE | Admit: 2016-08-30 | Discharge: 2016-08-30 | Disposition: A | Discharge status: Home / Self Care | Payer: Managed Care, Other (non HMO) | Source: Ambulatory Visit | Attending: Medical | Admitting: Medical

## 2016-08-30 VITALS — BP 106/60 | HR 68 | Ht 59.0 in | Wt 139.2 lb

## 2016-08-30 DIAGNOSIS — Z789 Other specified health status: Secondary | ICD-10-CM

## 2016-08-30 DIAGNOSIS — Z0000 Encounter for general adult medical examination without abnormal findings: Secondary | ICD-10-CM

## 2016-08-30 DIAGNOSIS — Z124 Encounter for screening for malignant neoplasm of cervix: Secondary | ICD-10-CM

## 2016-08-30 DIAGNOSIS — R5382 Chronic fatigue, unspecified: Secondary | ICD-10-CM

## 2016-08-30 DIAGNOSIS — Z3040 Encounter for surveillance of contraceptives, unspecified: Secondary | ICD-10-CM

## 2016-08-30 DIAGNOSIS — Z113 Encounter for screening for infections with a predominantly sexual mode of transmission: Secondary | ICD-10-CM | POA: Diagnosis present

## 2016-08-30 DIAGNOSIS — Z23 Encounter for immunization: Secondary | ICD-10-CM | POA: Diagnosis not present

## 2016-08-30 DIAGNOSIS — Z309 Encounter for contraceptive management, unspecified: Secondary | ICD-10-CM

## 2016-08-30 DIAGNOSIS — Z01419 Encounter for gynecological examination (general) (routine) without abnormal findings: Secondary | ICD-10-CM | POA: Diagnosis not present

## 2016-08-30 DIAGNOSIS — R0683 Snoring: Secondary | ICD-10-CM | POA: Diagnosis not present

## 2016-08-30 DIAGNOSIS — Z820 Family history of epilepsy and other diseases of the nervous system: Secondary | ICD-10-CM | POA: Diagnosis not present

## 2016-08-30 DIAGNOSIS — R400 Somnolence: Secondary | ICD-10-CM

## 2016-08-30 DIAGNOSIS — J309 Allergic rhinitis, unspecified: Secondary | ICD-10-CM

## 2016-08-30 DIAGNOSIS — Z8049 Family history of malignant neoplasm of other genital organs: Secondary | ICD-10-CM | POA: Diagnosis not present

## 2016-08-30 DIAGNOSIS — Z136 Encounter for screening for cardiovascular disorders: Secondary | ICD-10-CM | POA: Insufficient documentation

## 2016-08-30 LAB — POCT URINALYSIS DIPSTICK
Bilirubin, UA: NEGATIVE
Glucose, UA: NEGATIVE
Ketones, UA: NEGATIVE
Leukocytes, UA: NEGATIVE
NITRITE UA: NEGATIVE
PH UA: 6
PROTEIN UA: NEGATIVE
RBC UA: NEGATIVE
SPEC GRAV UA: 1.025
UROBILINOGEN UA: NEGATIVE

## 2016-08-30 LAB — POCT URINE PREGNANCY: PREG TEST UR: NEGATIVE

## 2016-08-30 MED ORDER — NORETHIN ACE-ETH ESTRAD-FE 1-20 MG-MCG(24) PO TABS: 1.0000 | ORAL_TABLET | Freq: Every day | ORAL | 11 refills | Status: DC

## 2016-08-30 MED ORDER — CETIRIZINE HCL 10 MG PO TABS: ORAL_TABLET | ORAL | 3 refills | Status: DC

## 2016-08-30 NOTE — Patient Instructions (Signed)

## 2016-08-30 NOTE — Progress Notes (Signed)
Subjective: Chief Complaint  Patient presents with  . physical    physical , flu shot, pap   Here for physical.  Doing fine.    Getting flu shot today.   No particular concerns.  Periods are light, regular, doing fine on current OCP.  No current sexual activity, no partner for last year.   She did recently get a tummy tuck and breast augmentation.  Still c/t chronic fatigue.  Awakes sleepy all the time. Taking a multivitamin.  Snores some with sinuses are acting up.  No witnessed apnea. sometimes dozing off in the daytime, but does take occasional daytime nap.  Usually gets to sleep ok.  No routine joint swelling or pains.   Mother has sleep apnea.    Both kids are in travel volleyball.  Reviewed their medical, surgical, family, social, medication, and allergy history and updated chart as appropriate.  Past Medical History:  Diagnosis Date  . Contraceptive management   . Herpes genitalis in women   . Seasonal allergic rhinitis   . Sinusitis    history of, worse as teenager  . Wears glasses     Past Surgical History:  Procedure Laterality Date  . BREAST ENH[facility]  [facility]  . CESAREAN SECTION     twice  . COSMETIC SURGERY  06/2016   [facility]  . HERNIA REPAIR  2014   umbilical    Social History   Social History  . Marital status: Single    Spouse name: N/A  . Number of children: 2  . Years of education: N/A   Occupational History  . 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 Smoking status: Never Smoker  . Smokeless tobacco: Never Used  . Alcohol use Yes     Comment: occasionally  . Drug use: No  . Sexual activity: Not Currently    Birth control/ protection: Condom   Other Topics Concern  . Not on file   Social History Narrative   Works for [employer], outside [employer]; exercise - not much in 2017, has been a runner in the past.  Have 2 children ages 709yo and 39yo, both  girls.   Girls play travel volleyball.   Divorced, no significant other as of 08/2016    Family History  Problem Relation Age of Onset  . Hypertension Mother   . Heart disease Mother 5650    MI, CHF, CABG  . Thyroid disease Mother   . Cancer Mother 2764    uterine cancer  . Arthritis Father   . Diabetes Father   . Anemia Sister   . Depression Sister   . Arthritis Paternal Grandmother   . Heart disease Paternal Grandmother   . Cancer Maternal Grandmother     pancreatic     Current Outpatient Prescriptions:  .  cetirizine (ZYRTEC) 10 MG tablet, TAKE 1 TABLET (10 MG TOTAL) BY MOUTH DAILY., Disp: 90 tablet, Rfl: 3 .  Norethindrone Acetate-Ethinyl Estrad-FE (LOESTRIN 24 FE) 1-20 MG-MCG(24) tablet, Take 1 tablet by mouth daily., Disp: 1 Package, Rfl: 11 .  Olopatadine HCl (PATADAY) 0.2 % SOLN, Place 1 drop into both eyes 2 (two) times daily. (Patient not taking: Reported on 08/30/2016), Disp: 2.5 mL, Rfl: 11  Allergies  Allergen Reactions  . Cephalosporins Itching and Swelling  . Mirena [Levonorgestrel]     Recurrent BV, bleeding  . Penicillins Itching, Swelling and Other (See Comments)    Patient  states that she can't move.    Review of Systems Constitutional: -fever, -chills, -sweats, -unexpected weight change, -decreased appetite, -fatigue Allergy: -sneezing, -itching, -congestion Dermatology: -changing moles, --rash, _+lumps(UNDER ARM) ENT: -runny nose, -ear pain, -sore throat, -hoarseness, -sinus pain, -teeth pain, - ringing in ears, -hearing loss, -nosebleeds Cardiology: -chest pain, -palpitations, -swelling, -difficulty breathing when lying flat, -waking up short of breath Respiratory: -cough, -shortness of breath, -difficulty breathing with exercise or exertion, -wheezing, -coughing up blood Gastroenterology: -abdominal pain, -nausea, -vomiting, -diarrhea, -constipation, -blood in stool, -changes in bowel movement, -difficulty swallowing or eating Hematology: -bleeding,  -bruising  Musculoskeletal: -joint aches, -muscle aches, -joint swelling, -back pain, -neck pain, -cramping, -changes in gait Ophthalmology: denies vision changes, eye redness, itching, discharge Urology: -burning with urination, -difficulty urinating, -blood in urine, -urinary frequency, -urgency, -incontinence Neurology: -headache, -weakness, -tingling, -numbness, -memory loss, -falls, -dizziness Psychology: -depressed mood, -agitation, -sleep problems     Objective:   Physical Exam  BP 106/60   Pulse 68   Ht 4\' 11"  (1.499 m)   Wt 139 lb 3.2 oz (63.1 kg)   LMP 08/14/2016   SpO2 97%   BMI 28.11 kg/m   General appearance: alert, no distress, WD/WN, lean AA female Skin: few scattered benign appearing macules, no worrisome lesions HEENT: normocephalic, conjunctiva/corneas normal, sclerae anicteric, PERRLA, EOMi, nares patent, no discharge or erythema, pharynx normal Oral cavity: MMM, tongue normal, teeth in good repair Neck: supple, no lymphadenopathy, no thyromegaly, no masses, normal ROM, no bruits Chest: non tender, normal shape and expansion Heart: RRR, normal S1, S2, no murmurs Lungs: CTA bilaterally, no wheezes, rhonchi, or rales Abdomen: +bs, soft,somewhat tender from recent cosmetic surgery, lower transverse surgical scar, umbilical surgical scar, otherwise non tender, non distended, no masses, no hepatomegaly, no splenomegaly, no bruits Back: non tender, normal ROM, no scoliosis Musculoskeletal: upper extremities non tender, no obvious deformity, normal ROM throughout, lower extremities non tender, no obvious deformity, normal ROM throughout Extremities: no edema, no cyanosis, no clubbing Pulses: 2+ symmetric, upper and lower extremities, normal cap refill Neurological: alert, oriented x 3, CN2-12 intact, strength normal upper extremities and lower extremities, sensation normal throughout, DTRs 2+ throughout, no cerebellar signs, gait normal Psychiatric: normal affect,  behavior normal, pleasant  Breast: deferred at her request given recent exam and mammogram at breast center, new surgical scars inferior to breasts Gyn: Normal external genitalia without lesions, vagina with normal mucosa, cervix without lesions, no cervical motion tenderness, no abnormal vaginal discharge.  Uterus and adnexa not enlarged, nontender, no masses.  Exam chaperoned by nurse. Rectal: deferred    Assessment and Plan :    Encounter Diagnoses  Name Primary?  . Routine general medical examination at a health care facility Yes  . Needs flu shot   . Screening for cervical cancer   . Family history of uterine cancer   . Screen for STD (sexually transmitted disease)   . Encounter for surveillance of contraceptives, unspecified contraceptive   . Chronic fatigue   . Family history of sleep apnea   . Chronic allergic rhinitis, unspecified seasonality, unspecified trigger   . Snoring   . Daytime somnolence   . Uses birth control     Physical exam - discussed healthy lifestyle, diet, exercise, preventative care, vaccinations, and addressed their concerns.  Fatigue, chronic allergic rhinitis - reviewed recent labs in chart.  Refer to ENT.  C/t mammogram q1-2 year, pap sent today, STD labs today See your eye doctor yearly for routine vision care. See your  dentist yearly for routine dental care including hygiene visits twice yearly. Counseled on the influenza virus vaccine.  Vaccine information sheet given.  Influenza vaccine given after consent obtained. Discussed mom's history of uterine cancer, signs/symptoms that would suggest similar.   Contraception management - discussed risks/benefits of medication,did fine on this medication prior.  Discussed safe sex, prevention.  C/t current OCPs. Follow-up pending labs  [name] was seen today for physical.  Diagnoses and all orders for this visit:  Routine general medical examination at a health care facility -     Urinalysis  Dipstick -     HIV antibody -     RPR -     Cytology - PAP -     Cancel: POCT urine pregnancy  Needs flu shot -     Flu Vaccine QUAD 36+ mos IM  Screening for cervical cancer -     Cytology - PAP  Family history of uterine cancer  Screen for STD (sexually transmitted disease) -     HIV antibody -     RPR -     Cytology - PAP  Encounter for surveillance of contraceptives, unspecified contraceptive -     HIV antibody -     RPR -     Cytology - PAP -     Cancel: POCT urine pregnancy  Chronic fatigue -     Ambulatory referral to ENT  Family history of sleep apnea -     Ambulatory referral to ENT  Chronic allergic rhinitis, unspecified seasonality, unspecified trigger -     Ambulatory referral to ENT  Snoring -     Ambulatory referral to ENT  Daytime somnolence -     Ambulatory referral to ENT  Uses birth control -     POCT urine pregnancy  Other orders -     Norethindrone Acetate-Ethinyl Estrad-FE (LOESTRIN 24 FE) 1-20 MG-MCG(24) tablet; Take 1 tablet by mouth daily. -     cetirizine (ZYRTEC) 10 MG tablet; TAKE 1 TABLET (10 MG TOTAL) BY MOUTH DAILY.

## 2016-08-31 LAB — CYTOLOGY - PAP
CHLAMYDIA, DNA PROBE: NEGATIVE
Diagnosis: NEGATIVE
NEISSERIA GONORRHEA: NEGATIVE

## 2016-08-31 LAB — RPR

## 2016-08-31 LAB — HIV ANTIBODY (ROUTINE TESTING W REFLEX): HIV: NONREACTIVE

## 2016-10-13 ENCOUNTER — Encounter: Payer: Self-pay | Admitting: Family Medicine

## 2016-10-13 ENCOUNTER — Ambulatory Visit (INDEPENDENT_AMBULATORY_CARE_PROVIDER_SITE_OTHER): Payer: Managed Care, Other (non HMO) | Admitting: Family Medicine

## 2016-10-13 VITALS — BP 112/72 | HR 72 | Temp 99.2°F | Ht 62.0 in | Wt 139.2 lb

## 2016-10-13 DIAGNOSIS — R05 Cough: Secondary | ICD-10-CM | POA: Diagnosis not present

## 2016-10-13 DIAGNOSIS — J069 Acute upper respiratory infection, unspecified: Secondary | ICD-10-CM | POA: Diagnosis not present

## 2016-10-13 DIAGNOSIS — R059 Cough, unspecified: Secondary | ICD-10-CM

## 2016-10-13 MED ORDER — BENZONATATE 200 MG PO CAPS: 200.0000 mg | ORAL_CAPSULE | Freq: Three times a day (TID) | ORAL | 0 refills | Status: DC | PRN

## 2016-10-13 NOTE — Progress Notes (Signed)
Chief Complaint  Patient presents with  . Cough    x 7days, mucus was green now clear. Was getting better until yesterday and today. Vomited 3 x today around 2-3pm. No fevers. Does have a headache. No chills or body aches.   Last week she started with "regular cold symptoms".  6 days ago she started coughing up a lot of phlegm, lost her voice over the weekend.  Earlier this week she had just a slight cough, only slight yellow, mostly clear drainage.  This morning, her cough has gotten progressively worse.  Phlegm is now all clear, but cough is more intense/frequent.  This afternoon she coughed so hard she vomited.  She has been taking Dayquil x 3 days, and Nyquil at night over the weekend (none x 3-4 days, didn't need it).  +sick contact--ex-husband with similar illness (she was sick before he was).  PMH, PSH, SH reviewed   Outpatient Encounter Prescriptions as of 10/13/2016  Medication Sig Note  . cetirizine (ZYRTEC) 10 MG tablet TAKE 1 TABLET (10 MG TOTAL) BY MOUTH DAILY.   Marland Kitchen Norethindrone Acetate-Ethinyl Estrad-FE (LOESTRIN 24 FE) 1-20 MG-MCG(24) tablet Take 1 tablet by mouth daily.   . [DISCONTINUED] Olopatadine HCl (PATADAY) 0.2 % SOLN Place 1 drop into both eyes 2 (two) times daily. 09/25/2015: prn   No facility-administered encounter medications on file as of 10/13/2016.    Allergies  Allergen Reactions  . Amoxicillin Hives  . Cephalosporins Itching, Swelling and Hives  . Mirena [Levonorgestrel]     Recurrent BV, bleeding  . Penicillins Itching, Swelling and Other (See Comments)    Patient states that she can't move.   ROS: see HPI. +sore throat. No ear pain. Has some mild chronic sinus congestion, no worse than normal.  Has a headache from coughing. No known fever, chills. Denies body aches. +post-tussive emesis; no nausea, abdominal pain, diarrhea, urinary symptoms  PHYSICAL EXAM:  BP 112/72 (BP Location: Left Arm, Patient Position: Sitting, Cuff Size: Normal)   Pulse 72    Temp 99.2 F (37.3 C) (Tympanic)   Ht _0  (1.575 m)   Wt 139 lb 3.2 oz (63.1 kg)   LMP 10/06/2016 (Exact Date)   BMI 25.46 kg/m   Pleasant, well-appearing female.  Frequent dry hacky cough throughout visit. Not interfering with her speech, in no distress HEENT: PERRL, EOMI, conjunctiva and sclera are clear.  TM's and EAC's normal.  Nasal mucosa is moderately edematous with clear mucus.  No sinus tenderness OP notable for thick clear-slightly white phlegm posteriorly, otherwise normal Neck: no lymphadenopathy or mass Heart: regular rate and rhythm without murmur Lungs: clear bilaterally.  No wheezes, rales, ronchi, or cough with deep inspiration.   Skin: normal turgor, no rash Neuro: alert and oriented, cranial nerves intact, normal strength, gait Psych: normal mood, affect, hygiene and grooming  ASSESSMENT/PLAN:  Acute upper respiratory infection  Cough - Plan: benzonatate (TESSALON) 200 MG capsule  Post-viral cough, with evidence of thick postnasal drainage.  Has lowgrade fever, but otherwise symptoms appear to be improving (purulence, congestion), other than the dry cough. Continue supportive measures; reviewed s/sx for which she should contact us or return.    Drink plenty of fluids. Your cough is partly related to thick postnasal drainage getting hung up in the back of the the throat. Use guaifenesin (expectorant--I recommend getting 12 hours Mucinex maximum strength 1262m and take it twice daily) Take a decongestant such as sudafed (either PE over the counter or--my recommendation is the 12 hour  pseudoephedrine that is behind the pharmacy counter; take it once daily in the morning; avoid taking it at night if it causes insomnia). Consider trying sinus rinses (sinus rinse kit or neti-pot).  Use the benzonatate (prescription) as needed for cough. You could also use Delsym syrup if needed for cough, preferably not both together, try and see which one is more effective (if  both are needed, there is no interaction).    Call Monday if you are having persistent/higher fevers, you develop discolored mucus or phlegm, sinus pain, etc so that we can send in an antibiotic.  Return here for re-evaluation if any shortness of breath, pain with breathing, or other concerns.

## 2016-10-13 NOTE — Patient Instructions (Signed)
   Drink plenty of fluids. Your cough is partly related to thick postnasal drainage getting hung up in the back of the the throat. Use guaifenesin (expectorant--I recommend getting 12 hours Mucinex maximum strength '1200mg'$  and take it twice daily) Take a decongestant such as sudafed (either PE over the counter or--my recommendation is the 12 hour pseudoephedrine that is behind the pharmacy counter; take it once daily in the morning; avoid taking it at night if it causes insomnia). Consider trying sinus rinses (sinus rinse kit or neti-pot).  Use the benzonatate (prescription) as needed for cough. You could also use Delsym syrup if needed for cough, preferably not both together, try and see which one is more effective (if both are needed, there is no interaction).    Call Monday if you are having persistent/higher fevers, you develop discolored mucus or phlegm, sinus pain, etc so that we can send in an antibiotic.  Return here for re-evaluation if any shortness of breath, pain with breathing, or other concerns.

## 2016-10-14 ENCOUNTER — Ambulatory Visit: Payer: Managed Care, Other (non HMO) | Admitting: Medical

## 2016-10-14 ENCOUNTER — Telehealth: Payer: Self-pay

## 2016-10-14 MED ORDER — AZITHROMYCIN 250 MG PO TABS: ORAL_TABLET | ORAL | 0 refills | Status: DC

## 2016-10-14 NOTE — Telephone Encounter (Signed)
Pt states phelgm is now yellow, fever 100.2 and body aches. Coughing more. Pt states that sx are worse. What do you suggest?   [phone].   Thanks, Rollene Rotundaebecca B

## 2016-10-14 NOTE — Telephone Encounter (Signed)
Advise pt that I sent a z-pak to her pharmacy, which should cover bronchitis or sinus infection. She had a low grade fever in the office yesterday, but if increasing and phlegm changed color, we should cover for a secondary bacterial infection complicating the recent cold she has had.  I can't rule out influenza--body aches, fever, etc, but given that she had cold symptoms that were improving, then acutely got worse, I think we should cover for a bacterial infection.   Have her drink plenty of fluids, tylenol or ibuprofen as needed for fever and aches, and get plenty of rest. Follow-up Monday if getting worse rather than better (or to UC/ER if significantly worse over the weekend).

## 2016-10-14 NOTE — Telephone Encounter (Signed)
Spoke with pt- she was made aware of recommendations and that rx called to pharmacy. Trixie Rude/RLB

## 2016-11-03 ENCOUNTER — Other Ambulatory Visit: Payer: Self-pay | Admitting: Medical

## 2016-11-03 ENCOUNTER — Encounter: Payer: Self-pay | Admitting: Medical

## 2016-11-03 MED ORDER — SULFAMETHOXAZOLE-TRIMETHOPRIM 800-160 MG PO TABS: 1.0000 | ORAL_TABLET | Freq: Two times a day (BID) | ORAL | 0 refills | Status: DC

## 2017-01-23 ENCOUNTER — Other Ambulatory Visit: Payer: Self-pay | Admitting: Medical

## 2017-02-06 ENCOUNTER — Telehealth: Payer: Self-pay

## 2017-02-06 MED ORDER — CETIRIZINE HCL 10 MG PO TABS: ORAL_TABLET | ORAL | 1 refills | Status: DC

## 2017-02-06 NOTE — Telephone Encounter (Signed)
Fax request for medication refill of cetirizine. Per Vickie ok to renew for 90 days with 1 RF. Trixie Rude/RLB

## 2017-02-16 ENCOUNTER — Other Ambulatory Visit: Payer: Self-pay | Admitting: Family Medicine

## 2017-02-16 NOTE — Telephone Encounter (Signed)
Is this okay to refill? 

## 2017-02-28 ENCOUNTER — Telehealth: Payer: Self-pay | Admitting: Medical

## 2017-02-28 MED ORDER — FLUCONAZOLE 150 MG PO TABS: 150.0000 mg | ORAL_TABLET | Freq: Once | ORAL | 0 refills | Status: AC

## 2017-02-28 NOTE — Telephone Encounter (Signed)
Pt has been out of town for a couple of days for work and fr the past day and 1/2 she started displaying yeast infection symptoms. Pt said her ph levels got off track. She is on her way in town now but not able to here for an appointment today. She has cottage cheese vaginal discharge and vaginal itch. Can she get Diflucan sent to LOCAL [facility] at [facility] on [address] in [place] today?

## 2017-02-28 NOTE — Telephone Encounter (Signed)
Pt informed

## 2017-02-28 NOTE — Telephone Encounter (Signed)
Let her know that I called the medication in to [facility] [place]

## 2017-05-25 ENCOUNTER — Other Ambulatory Visit: Payer: Self-pay | Admitting: Medical

## 2017-07-03 HISTORY — PX: AUGMENTATION MAMMAPLASTY: SUR837

## 2017-08-15 ENCOUNTER — Other Ambulatory Visit: Payer: Self-pay | Admitting: Medical

## 2017-08-15 NOTE — Telephone Encounter (Signed)
Called and l/m for pt call us back to set up an appt ,for refill.

## 2017-09-05 ENCOUNTER — Other Ambulatory Visit: Payer: Self-pay | Admitting: Medical

## 2017-09-05 DIAGNOSIS — Z1231 Encounter for screening mammogram for malignant neoplasm of breast: Secondary | ICD-10-CM

## 2017-09-22 ENCOUNTER — Encounter: Payer: Managed Care, Other (non HMO) | Admitting: Medical

## 2017-09-28 ENCOUNTER — Encounter: Payer: Self-pay | Admitting: Medical

## 2017-09-28 ENCOUNTER — Other Ambulatory Visit (HOSPITAL_COMMUNITY)
Admission: RE | Admit: 2017-09-28 | Discharge: 2017-09-28 | Disposition: A | Discharge status: Home / Self Care | Payer: 59 | Source: Ambulatory Visit | Attending: Medical | Admitting: Medical

## 2017-09-28 ENCOUNTER — Ambulatory Visit (INDEPENDENT_AMBULATORY_CARE_PROVIDER_SITE_OTHER): Payer: 59 | Admitting: Medical

## 2017-09-28 VITALS — BP 110/70 | HR 72 | Ht 62.0 in | Wt 137.8 lb

## 2017-09-28 DIAGNOSIS — Z8049 Family history of malignant neoplasm of other genital organs: Secondary | ICD-10-CM | POA: Diagnosis not present

## 2017-09-28 DIAGNOSIS — Z23 Encounter for immunization: Secondary | ICD-10-CM

## 2017-09-28 DIAGNOSIS — Z124 Encounter for screening for malignant neoplasm of cervix: Secondary | ICD-10-CM | POA: Diagnosis not present

## 2017-09-28 DIAGNOSIS — Z1239 Encounter for other screening for malignant neoplasm of breast: Secondary | ICD-10-CM

## 2017-09-28 DIAGNOSIS — J309 Allergic rhinitis, unspecified: Secondary | ICD-10-CM | POA: Diagnosis not present

## 2017-09-28 DIAGNOSIS — Z0000 Encounter for general adult medical examination without abnormal findings: Secondary | ICD-10-CM

## 2017-09-28 DIAGNOSIS — Z1231 Encounter for screening mammogram for malignant neoplasm of breast: Secondary | ICD-10-CM

## 2017-09-28 DIAGNOSIS — L739 Follicular disorder, unspecified: Secondary | ICD-10-CM | POA: Diagnosis not present

## 2017-09-28 DIAGNOSIS — Z3040 Encounter for surveillance of contraceptives, unspecified: Secondary | ICD-10-CM

## 2017-09-28 DIAGNOSIS — Z113 Encounter for screening for infections with a predominantly sexual mode of transmission: Secondary | ICD-10-CM

## 2017-09-28 LAB — POCT URINALYSIS DIP (PROADVANTAGE DEVICE)
BILIRUBIN UA: NEGATIVE mg/dL
Bilirubin, UA: NEGATIVE
Blood, UA: NEGATIVE
GLUCOSE UA: NEGATIVE mg/dL
Leukocytes, UA: NEGATIVE
Nitrite, UA: NEGATIVE
Protein Ur, POC: NEGATIVE mg/dL
SPECIFIC GRAVITY, URINE: 1.015
Urobilinogen, Ur: NEGATIVE
pH, UA: 7 (ref 5.0–8.0)

## 2017-09-28 LAB — POCT URINE PREGNANCY: Preg Test, Ur: NEGATIVE

## 2017-09-28 MED ORDER — NORETHIN ACE-ETH ESTRAD-FE 1-20 MG-MCG(24) PO TABS: 1.0000 | ORAL_TABLET | Freq: Every day | ORAL | 11 refills | Status: DC

## 2017-09-28 MED ORDER — CETIRIZINE HCL 10 MG PO TABS: ORAL_TABLET | ORAL | 3 refills | Status: DC

## 2017-09-28 MED ORDER — SULFAMETHOXAZOLE-TRIMETHOPRIM 800-160 MG PO TABS: 1.0000 | ORAL_TABLET | Freq: Two times a day (BID) | ORAL | 0 refills | Status: DC

## 2017-09-28 NOTE — Progress Notes (Signed)
Subjective: Chief Complaint  Patient presents with  . Annual Exam    physical ,pap , fasting labs    Here for physical.  Doing fine in general.  Possibly has ingrown hair, left inguinal region along panty line.    Wont come to head, but no redness or draining.    Not exercising as much as in summer time.  Girls 41yo and 41yo,playing travel volleyball  Periods are light, regular, doing fine on current OCP.   Reviewed their medical, surgical, family, social, medication, and allergy history and updated chart as appropriate.  Past Medical History:  Diagnosis Date  . Contraceptive management   . Herpes genitalis in women   . Seasonal allergic rhinitis   . Sinusitis    history of, worse as teenager  . Wears glasses     Past Surgical History:  Procedure Laterality Date  . BREAST ENHANCEMENT SURGERY  [facility]09/2016   [facility]  . CESAREAN SECTION     twice  . COSMETIC SURGERY  06/2016   [facility]  . HERNIA REPAIR  2014   umbilical    Social History   Socioeconomic History  . Marital status: Single    Spouse name: Not on file  . Number of children: 2  . Years of education: Not on file  . Highest education level: Not on file  Social Needs  . Financial resource strain: Not on file  . Food insecurity - worry: Not on file  . Food insecurity - inability: Not on file  . Transportation needs - medical: Not on file  . Transportation needs - non-medical: Not on file  Occupational History  . Occupation: territory Production assistant, [employer] ([employer])  Tobacco Use  . Smoking status: Never Smoker  . Smokeless tobacco: Never Used  Substance and Sexual Activity  . Alcohol use: Yes    Comment: occasionally  . Drug use: No  . Sexual activity: Not Currently    Birth control/protection: Condom  Other Topics Concern  . Not on file  Social History Narrative   Works for [employer], outside [employer]; consider franchise business for 2019.  Exercise -more in the  summer.  Have 2 children ages 41yo and [id number], both girls.   Girls play travel volleyball.   09/2017    Family History  Problem Relation Age of Onset  . Hypertension Mother   . Heart disease Mother 3250       MI, CHF, CABG  . Thyroid disease Mother   . Cancer Mother 3064       uterine cancer  . Other Mother        lumbar DDD  . Arthritis Father   . Diabetes Father   . Varicose Veins Father   . Anemia Sister   . Depression Sister   . Arthritis Paternal Grandmother   . Heart disease Paternal Grandmother   . Cancer Maternal Grandmother        pancreatic     Current Outpatient Medications:  .  cetirizine (ZYRTEC) 10 MG tablet, TAKE 1 TABLET (10 MG TOTAL) BY MOUTH DAILY., Disp: 90 tablet, Rfl: 3 .  Norethindrone Acetate-Ethinyl Estrad-FE (LOESTRIN 24 FE) 1-20 MG-MCG(24) tablet, Take 1 tablet by mouth daily., Disp: 1 Package, Rfl: 11 .  sulfamethoxazole-trimethoprim (BACTRIM DS,SEPTRA DS) 800-160 MG tablet, Take 1 tablet by mouth 2 (two) times daily., Disp: 14 tablet, Rfl: 0  Allergies  Allergen Reactions  . Amoxicillin Hives  . Cephalosporins Itching, Swelling and Hives  . Mirena [  Levonorgestrel]     Recurrent BV, bleeding  . Penicillins Itching, Swelling and Other (See Comments)    Patient states that she can't move.    Review of Systems Constitutional: -fever, -chills, -sweats, -unexpected weight change, -decreased appetite, -fatigue Allergy: -sneezing, -itching, -congestion Dermatology: -changing moles, --rash, +lumps(axilla, 1 in inguinal area left) ENT: -runny nose, -ear pain, -sore throat, -hoarseness, -sinus pain, -teeth pain, - ringing in ears, -hearing loss, -nosebleeds Cardiology: -chest pain, -palpitations, -swelling, -difficulty breathing when lying flat, -waking up short of breath Respiratory: -cough, -shortness of breath, -difficulty breathing with exercise or exertion, -wheezing, -coughing up blood Gastroenterology: -abdominal pain, -nausea, -vomiting, -diarrhea,  +constipation, -blood in stool, -changes in bowel movement, -difficulty swallowing or eating Hematology: -bleeding, -bruising  Musculoskeletal: -joint aches, -muscle aches, -joint swelling, -back pain, -neck pain, -cramping, -changes in gait Ophthalmology: denies vision changes, eye redness, itching, discharge Urology: -burning with urination, -difficulty urinating, -blood in urine, -urinary frequency, -urgency, -incontinence Neurology: -headache, -weakness, -tingling, -numbness, -memory loss, -falls, -dizziness Psychology: -depressed mood, -agitation, -sleep problems     Objective:   Physical Exam  BP 110/70   Pulse 72   Ht 5\' 2"  (1.575 m)   Wt 137 lb 12.8 oz (62.5 kg)   LMP 09/18/2017   SpO2 98%   BMI 25.20 kg/m   General appearance: alert, no distress, WD/WN, lean AA female Skin: left inginal region with 1.5 cm raised tender nodule, likely inflamed/infected hair follicle vs cyst, right axillar with 2 small 3-4 mm diameter subcutaneous nodules suggestive of cyst, and similar single one in left axilla, all unchanged for > 1 year per patient, otherwise few scattered benign appearing macules, no worrisome lesions HEENT: normocephalic, conjunctiva/corneas normal, sclerae anicteric, PERRLA, EOMi, nares patent, no discharge or erythema, pharynx normal Oral cavity: MMM, tongue normal, teeth in good repair Neck: supple, no lymphadenopathy, no thyromegaly, no masses, normal ROM, no bruits Chest: non tender, normal shape and expansion Heart: RRR, normal S1, S2, no murmurs Lungs: CTA bilaterally, no wheezes, rhonchi, or rales Abdomen: +bs, soft, linear tubal fullness along left lower lateral abdomen, likely bowel/stool burden, lower transverse surgical scar, umbilical surgical scar, otherwise non tender, non distended, no masses, no hepatomegaly, no splenomegaly, no bruits Back: non tender, normal ROM, no scoliosis Musculoskeletal: upper extremities non tender, no obvious deformity, normal ROM  throughout, lower extremities non tender, no obvious deformity, normal ROM throughout Extremities: no edema, no cyanosis, no clubbing Pulses: 2+ symmetric, upper and lower extremities, normal cap refill Neurological: alert, oriented x 3, CN2-12 intact, strength normal upper extremities and lower extremities, sensation normal throughout, DTRs 2+ throughout, no cerebellar signs, gait normal Psychiatric: normal affect, behavior normal, pleasant  Breast: no mass, skin change, surgical scars inferior to breasts Gyn: Normal external genitalia without lesions, vagina with normal mucosa, cervix without lesions, no cervical motion tenderness, no abnormal vaginal discharge.  Uterus and adnexa not enlarged, nontender, no masses.  Exam chaperoned by nurse. Rectal: deferred    Assessment and Plan :    Encounter Diagnoses  Name Primary?  . Routine general medical examination at a health care facility Yes  . Screening for cervical cancer   . Screen for STD (sexually transmitted disease)   . Encounter for surveillance of contraceptives, unspecified contraceptive   . Chronic allergic rhinitis   . Family history of uterine cancer   . Needs flu shot   . Screening for breast cancer   . Folliculitis     Physical exam - discussed healthy lifestyle, diet,  exercise, preventative care, vaccinations, and addressed their concerns.   C/t mammogram q1-2 year, pap sent today, STD labs today See your eye doctor yearly for routine vision care. See your dentist yearly for routine dental care including hygiene visits twice yearly.  Counseled on the influenza virus vaccine.  Vaccine information sheet given.  Influenza vaccine given after consent obtained.  Discussed mom's history of uterine cancer, signs/symptoms that would suggest similar.    Contraception management - discussed risks/benefits of medication,did fine on this medication prior.   Discussed safe sex, prevention.  C/t current  OCPs.  Folliculitis-begin Bactrim, warm compresses and if not resolving within 7-10 days let me know   on exam today in her left lower abdomen there was a fullness which I suspect was stool burden.   I had her palpate the area to and advised that this is gone within a day or 2 after a bowel movement then no worries but I would like to feel her abdomen again in a week or 2 just to make sure that it has resolved  Follow-up pending labs  Stevana was seen today for annual exam.  Diagnoses and all orders for this visit:  Routine general medical examination at a health care facility -     Comprehensive metabolic panel -     CBC -     Lipid panel -     HIV antibody -     RPR -     Cytology - PAP -     MM DIGITAL SCREENING BILATERAL; Future -     VITAMIN D 25 Hydroxy (Vit-D Deficiency, Fractures) -     POCT Urinalysis DIP (Proadvantage Device)  Screening for cervical cancer -     Cytology - PAP  Screen for STD (sexually transmitted disease) -     HIV antibody -     RPR -     Cytology - PAP  Encounter for surveillance of contraceptives, unspecified contraceptive -     POCT urine pregnancy  Chronic allergic rhinitis  Family history of uterine cancer  Needs flu shot -     Flu Vaccine QUAD 6+ mos PF IM (Fluarix Quad PF)  Screening for breast cancer -     MM DIGITAL SCREENING BILATERAL; Future  Folliculitis  Other orders -     sulfamethoxazole-trimethoprim (BACTRIM DS,SEPTRA DS) 800-160 MG tablet; Take 1 tablet by mouth 2 (two) times daily. -     Norethindrone Acetate-Ethinyl Estrad-FE (LOESTRIN 24 FE) 1-20 MG-MCG(24) tablet; Take 1 tablet by mouth daily. -     cetirizine (ZYRTEC) 10 MG tablet; TAKE 1 TABLET (10 MG TOTAL) BY MOUTH DAILY.

## 2017-09-29 LAB — COMPREHENSIVE METABOLIC PANEL
AG RATIO: 1.5 (calc) (ref 1.0–2.5)
ALBUMIN MSPROF: 4.2 g/dL (ref 3.6–5.1)
ALKALINE PHOSPHATASE (APISO): 48 U/L (ref 33–115)
ALT: 9 U/L (ref 6–29)
AST: 13 U/L (ref 10–30)
BUN: 8 mg/dL (ref 7–25)
CHLORIDE: 106 mmol/L (ref 98–110)
CO2: 24 mmol/L (ref 20–32)
CREATININE: 0.71 mg/dL (ref 0.50–1.10)
Calcium: 9.6 mg/dL (ref 8.6–10.2)
Globulin: 2.8 g/dL (calc) (ref 1.9–3.7)
Glucose, Bld: 79 mg/dL (ref 65–99)
POTASSIUM: 4.8 mmol/L (ref 3.5–5.3)
Sodium: 139 mmol/L (ref 135–146)
Total Bilirubin: 0.4 mg/dL (ref 0.2–1.2)
Total Protein: 7 g/dL (ref 6.1–8.1)

## 2017-09-29 LAB — CBC
HEMATOCRIT: 37.7 % (ref 35.0–45.0)
Hemoglobin: 12.7 g/dL (ref 11.7–15.5)
MCH: 29.5 pg (ref 27.0–33.0)
MCHC: 33.7 g/dL (ref 32.0–36.0)
MCV: 87.5 fL (ref 80.0–100.0)
MPV: 9.4 fL (ref 7.5–12.5)
PLATELETS: 355 10*3/uL (ref 140–400)
RBC: 4.31 10*6/uL (ref 3.80–5.10)
RDW: 13 % (ref 11.0–15.0)
WBC: 7.9 10*3/uL (ref 3.8–10.8)

## 2017-09-29 LAB — VITAMIN D 25 HYDROXY (VIT D DEFICIENCY, FRACTURES): Vit D, 25-Hydroxy: 17 ng/mL — ABNORMAL LOW (ref 30–100)

## 2017-09-29 LAB — LIPID PANEL
Cholesterol: 203 mg/dL — ABNORMAL HIGH (ref <200)
HDL: 81 mg/dL (ref >50)
LDL Cholesterol (Calc): 106 mg/dL (calc) — ABNORMAL HIGH
NON-HDL CHOLESTEROL (CALC): 122 mg/dL (ref <130)
Total CHOL/HDL Ratio: 2.5 (calc) (ref <5.0)
Triglycerides: 72 mg/dL (ref <150)

## 2017-09-29 LAB — RPR: RPR: NONREACTIVE

## 2017-09-29 LAB — HIV ANTIBODY (ROUTINE TESTING W REFLEX): HIV 1&2 Ab, 4th Generation: NONREACTIVE

## 2017-10-03 ENCOUNTER — Other Ambulatory Visit: Payer: Self-pay | Admitting: Medical

## 2017-10-03 LAB — CYTOLOGY - PAP
Adequacy: ABSENT
Chlamydia: NEGATIVE
Diagnosis: NEGATIVE
Neisseria Gonorrhea: NEGATIVE

## 2017-10-03 MED ORDER — VITAMIN D (ERGOCALCIFEROL) 1.25 MG (50000 UNIT) PO CAPS: 50000.0000 [IU] | ORAL_CAPSULE | ORAL | 3 refills | Status: DC

## 2017-10-04 ENCOUNTER — Ambulatory Visit
Admission: RE | Admit: 2017-10-04 | Discharge: 2017-10-04 | Disposition: A | Discharge status: Home / Self Care | Payer: Managed Care, Other (non HMO) | Source: Ambulatory Visit | Attending: Medical | Admitting: Medical

## 2017-10-04 ENCOUNTER — Other Ambulatory Visit: Payer: Self-pay | Admitting: Medical

## 2017-10-04 ENCOUNTER — Ambulatory Visit
Admission: RE | Admit: 2017-10-04 | Discharge: 2017-10-04 | Disposition: A | Discharge status: Home / Self Care | Payer: 59 | Source: Ambulatory Visit | Attending: Medical | Admitting: Medical

## 2017-10-04 DIAGNOSIS — Z1231 Encounter for screening mammogram for malignant neoplasm of breast: Secondary | ICD-10-CM

## 2018-01-18 ENCOUNTER — Encounter: Payer: Self-pay | Admitting: Family Medicine

## 2018-01-18 ENCOUNTER — Ambulatory Visit: Payer: 59 | Admitting: Family Medicine

## 2018-01-18 VITALS — BP 102/64 | HR 73 | Temp 98.6°F | Resp 16 | Wt 142.6 lb

## 2018-01-18 DIAGNOSIS — N3001 Acute cystitis with hematuria: Secondary | ICD-10-CM

## 2018-01-18 DIAGNOSIS — R350 Frequency of micturition: Secondary | ICD-10-CM | POA: Diagnosis not present

## 2018-01-18 LAB — POCT URINALYSIS DIP (PROADVANTAGE DEVICE)
Bilirubin, UA: NEGATIVE
GLUCOSE UA: NEGATIVE mg/dL
Ketones, POC UA: NEGATIVE mg/dL
NITRITE UA: NEGATIVE
PROTEIN UA: NEGATIVE mg/dL
Specific Gravity, Urine: 1.015
UUROB: NEGATIVE
pH, UA: 6.5 (ref 5.0–8.0)

## 2018-01-18 MED ORDER — SULFAMETHOXAZOLE-TRIMETHOPRIM 800-160 MG PO TABS: 1.0000 | ORAL_TABLET | Freq: Two times a day (BID) | ORAL | 0 refills | Status: DC

## 2018-01-18 NOTE — Progress Notes (Signed)
Chief Complaint  Patient presents with  . UTI    UTI- 3 days ago, frequency, odor,     Subjective:  [name] is a 42 y.o. female who complains of possible urinary tract infection.  She has had symptoms for 3 days.  Symptoms include urinary frequency, urgency . Patient denies fever, chills, adominal pain, back pain, N/V/D, vaginal discharge.  Last UTI was years ago.   Using nothing for current symptoms.    LMP: last month and due now. On OCPs.   Patient does not have a history of recurrent UTI. Patient does not have a history of pyelonephritis.  No other aggravating or relieving factors.  No other c/o.  Past Medical History:  Diagnosis Date  . Contraceptive management   . Herpes genitalis in women   . Seasonal allergic rhinitis   . Sinusitis    history of, worse as teenager  . Wears glasses     ROS as in subjective  Reviewed allergies, medications, past medical, surgical, and social history.    Objective: Vitals:   01/18/18 0916  BP: 102/64  Pulse: 73  Resp: 16  Temp: 98.6 F (37 C)  SpO2: 98%    General appearance: alert, no distress, WD/WN, female Abdomen: +bs, soft, non tender, non distended, no masses, no hepatomegaly, no splenomegaly, no bruits Back: no CVA tenderness GU: declines      Laboratory:  Urine dipstick: 1+ for leukocyte esterase and blood.       Assessment: Acute cystitis with hematuria - Plan: sulfamethoxazole-trimethoprim (BACTRIM DS,SEPTRA DS) 800-160 MG tablet, POCT Urinalysis DIP (Proadvantage Device), POCT Urinalysis DIP (Proadvantage Device)  Urinary frequency - Plan: POCT Urinalysis DIP (Proadvantage Device)    Plan: Discussed symptoms, diagnosis, possible complications, and usual course of illness.  Bactrim prescribed.  Advised increased water intake, can use OTC Tylenol for pain.        Urine culture not sent. No history of recurrent UTI.     Call or return if worse or not improving.

## 2018-02-01 ENCOUNTER — Other Ambulatory Visit (INDEPENDENT_AMBULATORY_CARE_PROVIDER_SITE_OTHER): Payer: 59

## 2018-02-01 DIAGNOSIS — R350 Frequency of micturition: Secondary | ICD-10-CM

## 2018-02-01 DIAGNOSIS — N3001 Acute cystitis with hematuria: Secondary | ICD-10-CM | POA: Diagnosis not present

## 2018-02-01 LAB — POCT URINALYSIS DIP (PROADVANTAGE DEVICE)
BILIRUBIN UA: NEGATIVE
GLUCOSE UA: NEGATIVE mg/dL
Ketones, POC UA: NEGATIVE mg/dL
Leukocytes, UA: NEGATIVE
NITRITE UA: NEGATIVE
Protein Ur, POC: NEGATIVE mg/dL
Specific Gravity, Urine: 1.015
Urobilinogen, Ur: NEGATIVE
pH, UA: 6 (ref 5.0–8.0)

## 2018-02-02 ENCOUNTER — Telehealth: Payer: Self-pay | Admitting: Medical

## 2018-02-02 ENCOUNTER — Encounter: Payer: Self-pay | Admitting: Medical

## 2018-02-02 ENCOUNTER — Ambulatory Visit: Payer: 59 | Admitting: Medical

## 2018-02-02 VITALS — BP 114/82 | HR 64 | Temp 98.1°F | Ht 62.5 in | Wt 143.8 lb

## 2018-02-02 DIAGNOSIS — R29898 Other symptoms and signs involving the musculoskeletal system: Secondary | ICD-10-CM | POA: Diagnosis not present

## 2018-02-02 DIAGNOSIS — M238X1 Other internal derangements of right knee: Secondary | ICD-10-CM | POA: Diagnosis not present

## 2018-02-02 DIAGNOSIS — R319 Hematuria, unspecified: Secondary | ICD-10-CM | POA: Insufficient documentation

## 2018-02-02 DIAGNOSIS — M238X2 Other internal derangements of left knee: Secondary | ICD-10-CM

## 2018-02-02 NOTE — Telephone Encounter (Signed)
Let her know that the urine microscopic did NOT show any bacteria or blood.   So if improved in symptoms, be reassured

## 2018-02-02 NOTE — Progress Notes (Signed)
Subjective: Chief Complaint  Patient presents with  . Hematuria    sample was left yesterday   . Knee Problem    knees click when going up and down stairs   She came in a week or so for UTI.   Was given antibiotic.   Was told to return and repeat urine test. She came yesterday and urine showed blood.  Here to discuss.   She no longer has dysuria or odor or urinary frequency as she did prior a week ago.  She finished antibiotic.  Having knee clicking going up and down stairs.   They don't hurt, no swelling.  Not exercising.   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 Contraceptive management   . Herpes genitalis in women   . Seasonal allergic rhinitis   . Sinusitis    history of, worse as teenager  . Wears glasses    Current Outpatient Medications on File Prior to Visit  Medication Sig Dispense Refill  . cetirizine (ZYRTEC) 10 MG tablet TAKE 1 TABLET (10 MG TOTAL) BY MOUTH DAILY. 90 tablet 3  . Norethindrone Acetate-Ethinyl Estrad-FE (LOESTRIN 24 FE) 1-20 MG-MCG(24) tablet Take 1 tablet by mouth daily. 1 Package 11  . Vitamin D, Ergocalciferol, (DRISDOL) 50000 units CAPS capsule Take 1 capsule (50,000 Units total) by mouth every 7 (seven) days. 12 capsule 3   No current facility-administered medications on file prior to visit.    ROS as in subjective    Objective: BP 114/82   Pulse 64   Temp 98.1 F (36.7 C) (Oral)   Ht 5' 2.5" (1.588 m)   Wt 143 lb 12.8 oz (65.2 kg)   SpO2 97%   BMI 25.88 kg/m   General appearance: alert, no distress, WD/WN,  Abdomen: +bs, soft, non tender, non distended, no masses, no hepatomegaly, no splenomegaly Back: nontender Knees bilat with mild laxity of MCL, and moderate crepitus, there is bony prominence over tibial tuberosity bilat likely from prior osgood slatters, but otherwise nonnteder, no swelling Pulses: 2+ symmetric, upper and lower  extremities, normal cap refill Lets neurovascularly intact    Assessment: Encounter Diagnoses  Name Primary?  . Popping sound of knee joint Yes  . Crepitus of both knee joints   . Hematuria, unspecified type      Plan: Popping sound of knees, crepitus - mild laxity of MCL, but no pain or other symptoms.  Advised she eat healthy oils such as fish, avocado, nuts, and consider taking fish oil supplement or Osteobioflex OTC.  Avoid deep knee bends, high knee stress activity  Hematuria - urine micro reviewed.  No blood or bacteria seen.  Reassured.  Azalynn was seen today for hematuria and knee problem.  Diagnoses and all orders for this visit:  Popping sound of knee joint  Crepitus of both knee joints  Hematuria, unspecified type

## 2018-02-05 ENCOUNTER — Ambulatory Visit: Payer: 59 | Admitting: Medical

## 2018-02-05 NOTE — Telephone Encounter (Signed)
lmom of results. Asked patient to call the office if her symptoms has not improved.

## 2018-07-16 ENCOUNTER — Encounter: Payer: Self-pay | Admitting: Medical

## 2018-07-16 ENCOUNTER — Ambulatory Visit: Payer: Medicaid Other | Admitting: Medical

## 2018-07-16 VITALS — BP 120/70 | HR 70 | Temp 98.1°F | Resp 16 | Ht 62.5 in | Wt 146.2 lb

## 2018-07-16 DIAGNOSIS — Z113 Encounter for screening for infections with a predominantly sexual mode of transmission: Secondary | ICD-10-CM

## 2018-07-16 DIAGNOSIS — N938 Other specified abnormal uterine and vaginal bleeding: Secondary | ICD-10-CM | POA: Diagnosis not present

## 2018-07-16 LAB — POCT URINALYSIS DIP (PROADVANTAGE DEVICE)
Bilirubin, UA: NEGATIVE
Glucose, UA: NEGATIVE mg/dL
Ketones, POC UA: NEGATIVE mg/dL
Leukocytes, UA: NEGATIVE
Nitrite, UA: NEGATIVE
PROTEIN UA: NEGATIVE mg/dL
SPECIFIC GRAVITY, URINE: 1.015
UUROB: NEGATIVE
pH, UA: 6 (ref 5.0–8.0)

## 2018-07-16 MED ORDER — MEDROXYPROGESTERONE ACETATE 5 MG PO TABS: 5.0000 mg | ORAL_TABLET | Freq: Every day | ORAL | 0 refills | Status: DC

## 2018-07-16 NOTE — Progress Notes (Signed)
Subjective: Chief Complaint  Patient presents with  . period    period for 2 weeks wont go away spotting 3/4 a tampoon    Here for breakthrough bleeding.  Up until the last month her periods have been regular without complaint, compliant with birth control, good about taking her medicine every single day.  However this past month her periods seem to come on midcycle and she is now had bleeding for 3 weeks straight.  She is on her third week now.  The bleeding is light however.  She did have one day last week with a bigger clump of blood with menstruation.  No history of fibroids or ovarian cyst.  She has a same sexual partner for the last year, no major concern for STD.  Her mother unfortunately passed away recently from metastases from cervical cancer.  So she has had increased stress of late but no other changes in her diet or exercise or activity.  She denies fever, pelvic pain, abdominal pain or back pain.  No urinary complaint.  Her sister has a history of uterine ablation for heavy bleeding.  No other aggravating or relieving factors. No other complaint.  Past Medical History:  Diagnosis Date  . Contraceptive management   . Herpes genitalis in women   . Seasonal allergic rhinitis   . Sinusitis    history of, worse as teenager  . Wears glasses    Current Outpatient Medications on File Prior to Visit  Medication Sig Dispense Refill  . cetirizine (ZYRTEC) 10 MG tablet TAKE 1 TABLET (10 MG TOTAL) BY MOUTH DAILY. 90 tablet 3  . Norethindrone Acetate-Ethinyl Estrad-FE (LOESTRIN 24 FE) 1-20 MG-MCG(24) tablet Take 1 tablet by mouth daily. 1 Package 11  . Vitamin D, Ergocalciferol, (DRISDOL) 50000 units CAPS capsule Take 1 capsule (50,000 Units total) by mouth every 7 (seven) days. 12 capsule 3   No current facility-administered medications on file prior to visit.    Family History  Problem Relation Age of Onset  . Hypertension Mother   . Heart disease Mother 76       MI, CHF, CABG  .  Thyroid disease Mother   . Cancer Mother 70       uterine cancer  . Other Mother        lumbar DDD  . Arthritis Father   . Diabetes Father   . Varicose Veins Father   . Anemia Sister   . Depression Sister   . Arthritis Paternal Grandmother   . Heart disease Paternal Grandmother   . Cancer Maternal Grandmother        pancreatic   ROS as in subjective   Objective: BP 120/70   Pulse 70   Temp 98.1 F (36.7 C) (Oral)   Resp 16   Ht 5' 2.5" (1.588 m)   Wt 146 lb 3.2 oz (66.3 kg)   SpO2 98%   BMI 26.31 kg/m   General: Well-developed well-nourished no acute distress Abdomen: Nontender no mass no organomegaly, positive bowel sounds GU declined    Assessment: Encounter Diagnoses  Name Primary?  . Dysfunctional uterine bleeding Yes  . Screen for STD (sexually transmitted disease)      Plan: We discussed possible causes that could include hormonal fluctuation, infection, fibroids or other.  Pap smear up-to-date.  Begin Provera for 5 days, labs as below follow-up pending labs.  If symptoms do not resolve in the next week or 2 he may need to get a pelvic ultrasound.  Skylyn was  seen today for period.  Diagnoses and all orders for this visit:  Dysfunctional uterine bleeding -     GC/Chlamydia Probe Amp -     POCT Urinalysis DIP (Proadvantage Device)  Screen for STD (sexually transmitted disease) -     GC/Chlamydia Probe Amp  Other orders -     medroxyPROGESTERone (PROVERA) 5 MG tablet; Take 1 tablet (5 mg total) by mouth daily.

## 2018-07-17 LAB — GC/CHLAMYDIA PROBE AMP
CHLAMYDIA, DNA PROBE: NEGATIVE
Neisseria gonorrhoeae by PCR: NEGATIVE

## 2018-08-02 ENCOUNTER — Telehealth: Payer: Self-pay

## 2018-08-02 NOTE — Telephone Encounter (Signed)
I recommend referral to gynecology as they can do pelvic ultrasound in house and give treatment options based on that information  If agreeable refer

## 2018-08-02 NOTE — Telephone Encounter (Signed)
Patient called left message on voicemail states that she is still having the same problems as when she came in for.  She is wanting to know what is the next step?  The medication is not working.

## 2018-08-03 ENCOUNTER — Other Ambulatory Visit: Payer: Self-pay

## 2018-08-03 DIAGNOSIS — Z8049 Family history of malignant neoplasm of other genital organs: Secondary | ICD-10-CM

## 2018-08-03 DIAGNOSIS — N938 Other specified abnormal uterine and vaginal bleeding: Secondary | ICD-10-CM

## 2018-08-03 NOTE — Telephone Encounter (Signed)
Patient notified of recommendations and is agreeable to referral to GYN.   I will refer patient to GYN.

## 2018-08-27 DIAGNOSIS — H5203 Hypermetropia, bilateral: Secondary | ICD-10-CM | POA: Diagnosis not present

## 2018-09-06 ENCOUNTER — Other Ambulatory Visit: Payer: Self-pay | Admitting: Medical

## 2018-09-06 DIAGNOSIS — Z1239 Encounter for other screening for malignant neoplasm of breast: Secondary | ICD-10-CM

## 2018-09-06 DIAGNOSIS — Z0000 Encounter for general adult medical examination without abnormal findings: Secondary | ICD-10-CM

## 2018-09-07 ENCOUNTER — Ambulatory Visit: Payer: Medicaid Other | Admitting: Medical

## 2018-09-07 ENCOUNTER — Encounter: Payer: Self-pay | Admitting: Medical

## 2018-09-07 VITALS — BP 110/70 | HR 67 | Temp 98.1°F | Resp 16 | Ht 64.0 in | Wt 148.4 lb

## 2018-09-07 DIAGNOSIS — L729 Follicular cyst of the skin and subcutaneous tissue, unspecified: Secondary | ICD-10-CM

## 2018-09-07 DIAGNOSIS — L089 Local infection of the skin and subcutaneous tissue, unspecified: Secondary | ICD-10-CM | POA: Diagnosis not present

## 2018-09-07 MED ORDER — SULFAMETHOXAZOLE-TRIMETHOPRIM 800-160 MG PO TABS: 1.0000 | ORAL_TABLET | Freq: Two times a day (BID) | ORAL | 1 refills | Status: DC

## 2018-09-07 NOTE — Patient Instructions (Signed)
Cyst of skin  Recommendations:  Use hot soapy baths with epsom salts, or use warm compresses daily the next 3-5 days, 20 minutes twice daily  Gently squeeze the area to try and express more fluid out of the cyst  Begin Bactrim antibiotic x 7 days  I refilled the antibiotic in case this occurs again  If not improving or worse in the next few days, recheck   Epidermal Cyst  An epidermal cyst is a small, painless lump under your skin. The cyst contains a grayish-white, bad-smelling substance (keratin). Do not try to pop or open an epidermal cyst yourself. What are the causes?  A blocked hair follicle.  A hair that curls and re-enters the skin instead of growing straight out of the skin.  A blocked pore.  Irritated skin.  An injury to the skin.  Certain conditions that are passed along from parent to child (inherited).  Human papillomavirus (HPV).  Long-term sun damage to the skin. What increases the risk?  Having acne.  Being overweight.  Being 42 years old. What are the signs or symptoms? These cysts are usually harmless, but they can get infected. Symptoms of infection may include:  Redness.  Inflammation.  Tenderness.  Warmth.  Fever.  A grayish-white, bad-smelling substance drains from the cyst.  Pus drains from the cyst. How is this treated? In many cases, epidermal cysts go away on their own without treatment. If a cyst becomes infected, treatment may include:  Opening and draining the cyst, done by a doctor. After draining, you may need minor surgery to remove the rest of the cyst.  Antibiotic medicine.  Shots of medicines (steroids) that help to reduce inflammation.  Surgery to remove the cyst. Surgery may be done if the cyst: ? Becomes large. ? Bothers you. ? Has a chance of turning into cancer.  Do not try to open a cyst yourself. Follow these instructions at home:  Take over-the-counter and prescription medicines only as told by  your doctor.  If you were prescribed an antibiotic medicine, take it it as told by your doctor. Do not stop using the antibiotic even if you start to feel better.  Keep the area around your cyst clean and dry.  Wear loose, dry clothing.  Avoid touching your cyst.  Check your cyst every day for signs of infection. Check for: ? Redness, swelling, or pain. ? Fluid or blood. ? Warmth. ? Pus or a bad smell.  Keep all follow-up visits as told by your doctor. This is important. How is this prevented?  Wear clean, dry, clothing.  Avoid wearing tight clothing.  Keep your skin clean and dry. Take showers or baths every day. Contact a doctor if:  Your cyst has symptoms of infection.  Your condition does not improve or gets worse.  You have a cyst that looks different from other cysts you have had.  You have a fever. Get help right away if:  Redness spreads from the cyst into the area close by. Summary  An epidermal cyst is a sac made of skin tissue.  If a cyst becomes infected, treatment may include surgery to open and drain the cyst, or to remove it.  Take over-the-counter and prescription medicines only as told by your doctor.  Contact a doctor if your condition is not improving or is getting worse.  Keep all follow-up visits as told by your doctor. This is important. This information is not intended to replace advice given to you by your  health care provider. Make sure you discuss any questions you have with your health care provider. Document Released: 10/13/2004 Document Revised: 03/19/2018 Document Reviewed: 07/08/2015 Elsevier Interactive Patient Education  2019 [employer]

## 2018-09-07 NOTE — Progress Notes (Signed)
Subjective: Chief Complaint  Patient presents with  . Cyst on thigh    syst on thigh busted this am    Here for cyst on thigh x 3 days.  She notes cyst in crease of left thigh.   Pus started draining out after opening up this morning.   No fever, no NVD.  Gets cysts such as this from time to time.   She notes that it is only about a fourth of the size it was this morning after the pus came out.  No other complaint.   Objective: BP 110/70   Pulse 67   Temp 98.1 F (36.7 C) (Oral)   Resp 16   Ht 5\' 4"  (1.626 m)   Wt 148 lb 6.4 oz (67.3 kg)   SpO2 98%   BMI 25.47 kg/m   General: Well-developed well-nourished no acute distress Left upper thigh in the intertrigo this area close to the perineum with a 2 cm x 3 cm area that is raised with slight fluctuance but no induration or erythema somewhat mildly tender.  There seems to be a small drainage hole present with some pus seepage present.  Exam chaperoned by nurse   Assessment: Encounter Diagnosis  Name Primary?  . Infected cyst of skin Yes    Plan: Discussed findings.  Gave options including I&D for larger drainage hole to better clear the infection vs conservative care.  She opts for conservative care.    Cyst of skin  Recommendations:  Use hot soapy baths with epsom salts, or use warm compresses daily the next 3-5 days, 20 minutes twice daily  Gently squeeze the area to try and express more fluid out of the cyst  Begin Bactrim antibiotic x 7 days  I refilled the antibiotic in case this occurs again  If not improving or worse in the next few days, recheck   [name] was seen today for cyst on thigh.  Diagnoses and all orders for this visit:  Infected cyst of skin  Other orders -     sulfamethoxazole-trimethoprim (BACTRIM DS,SEPTRA DS) 800-160 MG tablet; Take 1 tablet by mouth 2 (two) times daily.

## 2018-10-04 ENCOUNTER — Other Ambulatory Visit (HOSPITAL_COMMUNITY)
Admission: RE | Admit: 2018-10-04 | Discharge: 2018-10-04 | Disposition: A | Discharge status: Home / Self Care | Payer: Medicaid Other | Source: Ambulatory Visit | Attending: Medical | Admitting: Medical

## 2018-10-04 ENCOUNTER — Ambulatory Visit: Payer: Medicaid Other | Admitting: Medical

## 2018-10-04 ENCOUNTER — Encounter: Payer: Self-pay | Admitting: Medical

## 2018-10-04 VITALS — BP 122/78 | HR 66 | Temp 98.0°F | Ht 64.0 in | Wt 148.4 lb

## 2018-10-04 DIAGNOSIS — Z124 Encounter for screening for malignant neoplasm of cervix: Secondary | ICD-10-CM

## 2018-10-04 DIAGNOSIS — Z0000 Encounter for general adult medical examination without abnormal findings: Secondary | ICD-10-CM | POA: Diagnosis not present

## 2018-10-04 DIAGNOSIS — Z8049 Family history of malignant neoplasm of other genital organs: Secondary | ICD-10-CM | POA: Insufficient documentation

## 2018-10-04 DIAGNOSIS — Z309 Encounter for contraceptive management, unspecified: Secondary | ICD-10-CM

## 2018-10-04 DIAGNOSIS — K5909 Other constipation: Secondary | ICD-10-CM | POA: Diagnosis not present

## 2018-10-04 DIAGNOSIS — Z1239 Encounter for other screening for malignant neoplasm of breast: Secondary | ICD-10-CM | POA: Diagnosis not present

## 2018-10-04 DIAGNOSIS — Z113 Encounter for screening for infections with a predominantly sexual mode of transmission: Secondary | ICD-10-CM | POA: Diagnosis not present

## 2018-10-04 LAB — POCT URINALYSIS DIP (PROADVANTAGE DEVICE)
Bilirubin, UA: NEGATIVE
Glucose, UA: NEGATIVE mg/dL
Ketones, POC UA: NEGATIVE mg/dL
LEUKOCYTES UA: NEGATIVE
NITRITE UA: NEGATIVE
PH UA: 6.5 (ref 5.0–8.0)
PROTEIN UA: NEGATIVE mg/dL
Specific Gravity, Urine: 1.015
Urobilinogen, Ur: NEGATIVE

## 2018-10-04 LAB — POCT URINE PREGNANCY: PREG TEST UR: NEGATIVE

## 2018-10-04 NOTE — Progress Notes (Signed)
Subjective: Chief Complaint  Patient presents with  . Annual Exam    with pap   Here for physical.  Doing fine in general.  Not exercising as much as in summer time.  Periods are light, regular, doing fine on current OCP.   Constipation - did fine with trial of amitiza in the past.  Reviewed their medical, surgical, family, social, medication, and allergy history and updated chart as appropriate.  Past Medical History:  Diagnosis Date  . Contraceptive management   . Herpes genitalis in women   . Seasonal allergic rhinitis   . Sinusitis    history of, worse as teenager  . Wears glasses     Past Surgical History:  Procedure Laterality Date  . AUGMENTATION MAMMAPLASTY Bilateral 07/03/2017  . BREAST ENHANCEMENT SURGERY  07/09/2016   [facility] medical school  . CESAREAN SECTION     twice  . COSMETIC SURGERY  06/2016   tummy tuck, [facility]  . HERNIA REPAIR  2014   umbilical    Social History   Socioeconomic History  . Marital status: Single    Spouse name: Not on file  . Number of children: 2  . Years of education: Not on file  . Highest education level: Not on file  Occupational History  . 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 Financial resource strain: Not on file  . Food insecurity:    Worry: Not on file    Inability: Not on file  . Transportation needs:    Medical: Not on file    Non-medical: Not on file  Tobacco Use  . Smoking status: Never Smoker  . Smokeless tobacco: Never Used  Substance and Sexual Activity  . Alcohol use: Yes    Comment: occasionally  . Drug use: No  . Sexual activity: Not Currently    Birth control/protection: Condom  Lifestyle  . Physical activity:    Days per week: Not on file    Minutes per session: Not on file  . Stress: Not on file  Relationships  . Social connections:    Talks on phone: Not on file    Gets together: Not on file    Attends religious service: Not on file     Active member of club or organization: Not on file    Attends meetings of clubs or organizations: Not on file    Relationship status: Not on file  . Intimate partner violence:    Fear of current or ex partner: Not on file    Emotionally abused: Not on file    Physically abused: Not on file    Forced sexual activity: Not on file  Other Topics Concern  . Not on file  Social History Narrative   Started [employer] 2019.    Works for [employer], outside Airline pilot.  Exercise - some.  Have 2 children ages 8yo and 62yo, both girls.   Girls play travel volleyball.   09/2018    Family History  Problem Relation Age of Onset  . Hypertension Mother   . Heart disease Mother 25       MI, CHF, CABG  . Thyroid disease Mother   . Cancer Mother 55       uterine cancer, liver  . Other Mother        lumbar DDD  . Arthritis Father   . Diabetes Father   . Varicose Veins Father   . Anemia Sister   . Depression Sister   .  Arthritis Paternal Grandmother   . Heart disease Paternal Grandmother   . Cancer Maternal Grandmother        pancreatic     Current Outpatient Medications:  .  cetirizine (ZYRTEC) 10 MG tablet, TAKE 1 TABLET (10 MG TOTAL) BY MOUTH DAILY., Disp: 90 tablet, Rfl: 3 .  Norethindrone Acetate-Ethinyl Estrad-FE (LOESTRIN 24 FE) 1-20 MG-MCG(24) tablet, Take 1 tablet by mouth daily., Disp: 1 Package, Rfl: 11 .  Vitamin D, Ergocalciferol, (DRISDOL) 50000 units CAPS capsule, Take 1 capsule (50,000 Units total) by mouth every 7 (seven) days., Disp: 12 capsule, Rfl: 3  Allergies  Allergen Reactions  . Amoxicillin Hives  . Cephalosporins Itching, Swelling and Hives  . Mirena [Levonorgestrel]     Recurrent BV, bleeding  . Penicillins Itching, Swelling and Other (See Comments)    Patient states that she can't move.    Review of Systems Constitutional: -fever, -chills, -sweats, -unexpected weight change, -decreased appetite, -fatigue Allergy: -sneezing, -itching,  -congestion Dermatology: -changing moles, --rash, +lumps(axilla, 1 in inguinal area left) ENT: -runny nose, -ear pain, -sore throat, -hoarseness, -sinus pain, -teeth pain, - ringing in ears, -hearing loss, -nosebleeds Cardiology: -chest pain, -palpitations, -swelling, -difficulty breathing when lying flat, -waking up short of breath Respiratory: -cough, -shortness of breath, -difficulty breathing with exercise or exertion, -wheezing, -coughing up blood Gastroenterology: -abdominal pain, -nausea, -vomiting, -diarrhea, +constipation, -blood in stool, -changes in bowel movement, -difficulty swallowing or eating Hematology: -bleeding, -bruising  Musculoskeletal: -joint aches, -muscle aches, -joint swelling, -back pain, -neck pain, -cramping, -changes in gait Ophthalmology: denies vision changes, eye redness, itching, discharge Urology: -burning with urination, -difficulty urinating, -blood in urine, -urinary frequency, -urgency, -incontinence Neurology: -headache, -weakness, -tingling, -numbness, -memory loss, -falls, -dizziness Psychology: -depressed mood, -agitation, -sleep problems     Objective:   Physical Exam  BP 122/78   Pulse 66   Temp 98 F (36.7 C) (Oral)   Ht 5\' 4"  (1.626 m)   Wt 148 lb 6.4 oz (67.3 kg)   SpO2 98%   BMI 25.47 kg/m   General appearance: alert, no distress, WD/WN, lean AA female Skin: few scattered benign appearing macules, no worrisome lesions HEENT: normocephalic, conjunctiva/corneas normal, sclerae anicteric, PERRLA, EOMi, nares patent, no discharge or erythema, pharynx normal Oral cavity: MMM, tongue normal, teeth in good repair Neck: supple, no lymphadenopathy, no thyromegaly, no masses, normal ROM, no bruits Chest: non tender, normal shape and expansion Heart: RRR, normal S1, S2, no murmurs Lungs: CTA bilaterally, no wheezes, rhonchi, or rales Abdomen: +bs, soft, umbilical surgical scar, otherwise non tender, non distended, no masses, no hepatomegaly, no  splenomegaly, no bruits Back: non tender, normal ROM, no scoliosis Musculoskeletal: upper extremities non tender, no obvious deformity, normal ROM throughout, lower extremities non tender, no obvious deformity, normal ROM throughout Extremities: no edema, no cyanosis, no clubbing Pulses: 2+ symmetric, upper and lower extremities, normal cap refill Neurological: alert, oriented x 3, CN2-12 intact, strength normal upper extremities and lower extremities, sensation normal throughout, DTRs 2+ throughout, no cerebellar signs, gait normal Psychiatric: normal affect, behavior normal, pleasant  Breast: no mass, skin change, surgical scars inferior to breasts Gyn: Normal external genitalia without lesions, vagina with normal mucosa, cervix without lesions, no cervical motion tenderness, no abnormal vaginal discharge.  Uterus and adnexa not enlarged, nontender, no masses.  Exam chaperoned by nurse. Rectal: deferred    Assessment and Plan :    Encounter Diagnoses  Name Primary?  . Encounter for health maintenance examination in adult Yes  . Family  history of uterine cancer   . Screening for cervical cancer   . Screening for breast cancer   . Screen for STD (sexually transmitted disease)   . Chronic constipation   . Encounter for contraceptive management, unspecified type     Physical exam - discussed healthy lifestyle, diet, exercise, preventative care, vaccinations, and addressed their concerns.   C/t mammogram q1-2 year, pap sent today, STD labs today See your eye doctor yearly for routine vision care. See your dentist yearly for routine dental care including hygiene visits twice yearly.  Contraception management - discussed risks/benefits of medication,did fine on this medication prior.   Discussed safe sex, prevention.  C/t current OCPs.  constipation - counseled on fiber and water intake, can use OTC miralax daily, and consider GI consult.    Follow-up pending labs  [name] was seen  today for annual exam.  Diagnoses and all orders for this visit:  Encounter for health maintenance examination in adult -     POCT Urinalysis DIP (Proadvantage Device) -     Comprehensive metabolic panel -     CBC -     Lipid panel -     HIV Antibody (routine testing w rflx) -     RPR -     Cytology - PAP([place]) -     VITAMIN D 25 Hydroxy (Vit-D Deficiency, Fractures) -     POCT urine pregnancy  Family history of uterine cancer -     Cytology - PAP([place])  Screening for cervical cancer -     Cytology - PAP([place])  Screening for breast cancer  Screen for STD (sexually transmitted disease) -     HIV Antibody (routine testing w rflx) -     RPR -     Cytology - PAP([place])  Chronic constipation  Encounter for contraceptive management, unspecified type -     POCT urine pregnancy

## 2018-10-05 ENCOUNTER — Other Ambulatory Visit: Payer: Self-pay | Admitting: Medical

## 2018-10-05 LAB — HIV ANTIBODY (ROUTINE TESTING W REFLEX): HIV SCREEN 4TH GENERATION: NONREACTIVE

## 2018-10-05 LAB — RPR: RPR Ser Ql: NONREACTIVE

## 2018-10-05 LAB — CBC
HEMOGLOBIN: 12.6 g/dL (ref 11.1–15.9)
Hematocrit: 37 % (ref 34.0–46.6)
MCH: 29.8 pg (ref 26.6–33.0)
MCHC: 34.1 g/dL (ref 31.5–35.7)
MCV: 88 fL (ref 79–97)
Platelets: 343 10*3/uL (ref 150–450)
RBC: 4.23 x10E6/uL (ref 3.77–5.28)
RDW: 13 % (ref 11.7–15.4)
WBC: 7.2 10*3/uL (ref 3.4–10.8)

## 2018-10-05 LAB — LIPID PANEL
CHOLESTEROL TOTAL: 210 mg/dL — AB (ref 100–199)
Chol/HDL Ratio: 3.3 ratio (ref 0.0–4.4)
HDL: 63 mg/dL (ref >39)
LDL CALC: 130 mg/dL — AB (ref 0–99)
Triglycerides: 86 mg/dL (ref 0–149)
VLDL CHOLESTEROL CAL: 17 mg/dL (ref 5–40)

## 2018-10-05 LAB — COMPREHENSIVE METABOLIC PANEL
ALK PHOS: 58 IU/L (ref 39–117)
ALT: 18 IU/L (ref 0–32)
AST: 16 IU/L (ref 0–40)
Albumin/Globulin Ratio: 1.6 (ref 1.2–2.2)
Albumin: 4.4 g/dL (ref 3.5–5.5)
BUN/Creatinine Ratio: 10 (ref 9–23)
BUN: 8 mg/dL (ref 6–24)
Bilirubin Total: 0.4 mg/dL (ref 0.0–1.2)
CALCIUM: 9.4 mg/dL (ref 8.7–10.2)
CO2: 23 mmol/L (ref 20–29)
CREATININE: 0.79 mg/dL (ref 0.57–1.00)
Chloride: 104 mmol/L (ref 96–106)
GFR calc Af Amer: 108 mL/min/{1.73_m2} (ref >59)
GFR, EST NON AFRICAN AMERICAN: 93 mL/min/{1.73_m2} (ref >59)
Globulin, Total: 2.7 g/dL (ref 1.5–4.5)
Glucose: 77 mg/dL (ref 65–99)
Potassium: 5 mmol/L (ref 3.5–5.2)
Sodium: 140 mmol/L (ref 134–144)
Total Protein: 7.1 g/dL (ref 6.0–8.5)

## 2018-10-05 LAB — VITAMIN D 25 HYDROXY (VIT D DEFICIENCY, FRACTURES): Vit D, 25-Hydroxy: 33.6 ng/mL (ref 30.0–100.0)

## 2018-10-05 MED ORDER — VITAMIN D 25 MCG (1000 UNIT) PO TABS: 1000.0000 [IU] | ORAL_TABLET | Freq: Every day | ORAL | 3 refills | Status: DC

## 2018-10-08 ENCOUNTER — Other Ambulatory Visit (INDEPENDENT_AMBULATORY_CARE_PROVIDER_SITE_OTHER): Payer: Medicaid Other

## 2018-10-08 DIAGNOSIS — R319 Hematuria, unspecified: Secondary | ICD-10-CM | POA: Diagnosis not present

## 2018-10-08 LAB — POCT URINALYSIS DIP (PROADVANTAGE DEVICE)
Bilirubin, UA: NEGATIVE
Glucose, UA: NEGATIVE mg/dL
Leukocytes, UA: NEGATIVE
Nitrite, UA: NEGATIVE
PH UA: 6 (ref 5.0–8.0)
PROTEIN UA: NEGATIVE mg/dL
SPECIFIC GRAVITY, URINE: 1.02
UUROB: NEGATIVE

## 2018-10-10 ENCOUNTER — Ambulatory Visit: Payer: Medicaid Other | Admitting: Medical

## 2018-10-10 ENCOUNTER — Other Ambulatory Visit: Payer: Self-pay | Admitting: Medical

## 2018-10-10 LAB — CYTOLOGY - PAP
Chlamydia: NEGATIVE
DIAGNOSIS: NEGATIVE
Neisseria Gonorrhea: NEGATIVE

## 2018-10-10 MED ORDER — FLUCONAZOLE 150 MG PO TABS: ORAL_TABLET | ORAL | 0 refills | Status: DC

## 2018-10-15 ENCOUNTER — Ambulatory Visit
Admission: RE | Admit: 2018-10-15 | Discharge: 2018-10-15 | Disposition: A | Discharge status: Home / Self Care | Payer: Medicaid Other | Source: Ambulatory Visit | Attending: Medical | Admitting: Medical

## 2018-10-15 DIAGNOSIS — Z0000 Encounter for general adult medical examination without abnormal findings: Secondary | ICD-10-CM

## 2018-10-15 DIAGNOSIS — Z1231 Encounter for screening mammogram for malignant neoplasm of breast: Secondary | ICD-10-CM | POA: Diagnosis not present

## 2018-10-15 DIAGNOSIS — Z1239 Encounter for other screening for malignant neoplasm of breast: Secondary | ICD-10-CM

## 2018-12-15 ENCOUNTER — Other Ambulatory Visit: Payer: Self-pay | Admitting: Medical

## 2018-12-17 NOTE — Telephone Encounter (Signed)
Is this ok to refill?  

## 2018-12-17 NOTE — Telephone Encounter (Signed)
Left message on voicemail for patient to call back. 

## 2018-12-17 NOTE — Telephone Encounter (Signed)
Please call patient.  I got a refill request for birth control different than the one I sent back in January.  Please clarify?  I also got a refill on her allergy pill which I will send, but give me feedback on the birth control

## 2018-12-18 ENCOUNTER — Other Ambulatory Visit: Payer: Self-pay | Admitting: Medical

## 2018-12-18 MED ORDER — CETIRIZINE HCL 10 MG PO TABS: ORAL_TABLET | ORAL | 3 refills | Status: DC

## 2018-12-19 ENCOUNTER — Other Ambulatory Visit: Payer: Self-pay | Admitting: Medical

## 2018-12-19 MED ORDER — NORETHIN ACE-ETH ESTRAD-FE 1-20 MG-MCG(24) PO TABS: 1.0000 | ORAL_TABLET | Freq: Every day | ORAL | 11 refills | Status: DC

## 2018-12-19 MED ORDER — CETIRIZINE HCL 10 MG PO TABS: ORAL_TABLET | ORAL | 3 refills | Status: DC

## 2018-12-19 NOTE — Telephone Encounter (Signed)
This is to clarify, disregard the quantity in [facility] earlier script today.

## 2018-12-19 NOTE — Telephone Encounter (Signed)
Patient sent you a message this morning on this .

## 2019-01-31 ENCOUNTER — Other Ambulatory Visit: Payer: Self-pay | Admitting: Medical

## 2019-02-01 NOTE — Telephone Encounter (Signed)
Per lab work- start on vitamin D 1,000 units

## 2019-07-09 DIAGNOSIS — Z20828 Contact with and (suspected) exposure to other viral communicable diseases: Secondary | ICD-10-CM | POA: Diagnosis not present

## 2019-09-30 DIAGNOSIS — Z20828 Contact with and (suspected) exposure to other viral communicable diseases: Secondary | ICD-10-CM | POA: Diagnosis not present

## 2019-10-24 ENCOUNTER — Encounter: Payer: Medicaid Other | Admitting: Medical

## 2019-10-29 DIAGNOSIS — Z20828 Contact with and (suspected) exposure to other viral communicable diseases: Secondary | ICD-10-CM | POA: Diagnosis not present

## 2019-11-15 ENCOUNTER — Other Ambulatory Visit: Payer: Self-pay | Admitting: Medical

## 2019-11-15 DIAGNOSIS — Z1231 Encounter for screening mammogram for malignant neoplasm of breast: Secondary | ICD-10-CM

## 2019-11-18 DIAGNOSIS — Z03818 Encounter for observation for suspected exposure to other biological agents ruled out: Secondary | ICD-10-CM | POA: Diagnosis not present

## 2019-11-26 ENCOUNTER — Encounter: Payer: Self-pay | Admitting: Medical

## 2019-11-26 ENCOUNTER — Ambulatory Visit: Payer: Medicaid Other | Admitting: Medical

## 2019-11-26 ENCOUNTER — Other Ambulatory Visit (HOSPITAL_COMMUNITY)
Admission: RE | Admit: 2019-11-26 | Discharge: 2019-11-26 | Disposition: A | Discharge status: Home / Self Care | Payer: Medicaid Other | Source: Ambulatory Visit | Attending: Medical | Admitting: Medical

## 2019-11-26 ENCOUNTER — Other Ambulatory Visit: Payer: Self-pay

## 2019-11-26 VITALS — BP 112/72 | HR 79 | Temp 98.5°F | Ht 64.0 in | Wt 150.2 lb

## 2019-11-26 DIAGNOSIS — Z0000 Encounter for general adult medical examination without abnormal findings: Secondary | ICD-10-CM | POA: Insufficient documentation

## 2019-11-26 DIAGNOSIS — Z113 Encounter for screening for infections with a predominantly sexual mode of transmission: Secondary | ICD-10-CM | POA: Diagnosis not present

## 2019-11-26 DIAGNOSIS — J309 Allergic rhinitis, unspecified: Secondary | ICD-10-CM

## 2019-11-26 DIAGNOSIS — Z8049 Family history of malignant neoplasm of other genital organs: Secondary | ICD-10-CM

## 2019-11-26 DIAGNOSIS — Z23 Encounter for immunization: Secondary | ICD-10-CM

## 2019-11-26 DIAGNOSIS — Z124 Encounter for screening for malignant neoplasm of cervix: Secondary | ICD-10-CM | POA: Insufficient documentation

## 2019-11-26 DIAGNOSIS — Z3040 Encounter for surveillance of contraceptives, unspecified: Secondary | ICD-10-CM

## 2019-11-26 DIAGNOSIS — Z136 Encounter for screening for cardiovascular disorders: Secondary | ICD-10-CM

## 2019-11-26 DIAGNOSIS — Z8249 Family history of ischemic heart disease and other diseases of the circulatory system: Secondary | ICD-10-CM | POA: Insufficient documentation

## 2019-11-26 LAB — POCT URINE PREGNANCY: Preg Test, Ur: NEGATIVE

## 2019-11-26 LAB — RESULTS CONSOLE HPV: CHL HPV: NEGATIVE

## 2019-11-26 LAB — HM PAP SMEAR: HM Pap smear: NEGATIVE

## 2019-11-26 NOTE — Progress Notes (Signed)
Subjective: Chief Complaint  Patient presents with  . Annual Exam    with fasting labs    Here for physical.  Doing fine in general.  contraception - wants to continue same medicaiton.   No current pelvic symptoms, periods regular.  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    Reviewed their medical, surgical, family, social, medication, and allergy history and updated chart as appropriate.  Past Medical History:  Diagnosis Date  . Contraceptive management   . Herpes genitalis in women   . Seasonal allergic rhinitis   . Sinusitis    history of, worse as teenager  . Wears glasses     Past Surgical History:  Procedure Laterality Date  . AUGMENTATION MAMMAPLASTY Bilateral 07/03/2017  . BREAST ENHANCEMENT SURGERY  07/09/2016   [facility] medical school  . CESAREAN SECTION     twice  . COSMETIC SURGERY  06/2016   tummy tuck, [facility]  . HERNIA REPAIR  4403   umbilical    Social History   Socioeconomic History  . Marital status: Single    Spouse name: Not on file  . Number of children: 2  . Years of education: Not on file  . Highest education level: Not on file  Occupational History  . Occupation: territory Games developer)  Tobacco Use  . Smoking status: Never Smoker  . Smokeless tobacco: Never Used  Substance and Sexual Activity  . Alcohol use: Yes    Comment: occasionally  . Drug use: No  . Sexual activity: Not Currently    Birth control/protection: Condom  Other Topics Concern  . Not on file  Social History Narrative   Started [employer] 2019.  Exercise - some.  Have 2 children.   Girls play travel volleyball.   09/2019   Social Determinants of Health   Financial Resource Strain:   . Difficulty of Paying Living Expenses: Not on file  Food Insecurity:    . Worried About Charity fundraiser in the Last Year: Not on file  . Ran Out of Food in the Last Year: Not on file  Transportation Needs:   . Lack of Transportation (Medical): Not on file  . Lack of Transportation (Non-Medical): Not on file  Physical Activity:   . Days of Exercise per Week: Not on file  . Minutes of Exercise per Session: Not on file  Stress:   . Feeling of Stress : Not on file  Social Connections:   . Frequency of Communication with Friends and Family: Not on file  . Frequency of Social Gatherings with Friends and Family: Not on file  . Attends Religious Services: Not on file  . Active Member of Clubs or Organizations: Not on file  . Attends Archivist Meetings: Not on file  . Marital Status: Not on file  Intimate Partner Violence:   . Fear of Current or Ex-Partner: Not on file  . Emotionally Abused: Not on file  . Physically Abused: Not on file  . Sexually Abused: Not on file    Family History  Problem Relation Age of Onset  . Hypertension Mother   . Heart disease Mother 24       MI, CHF, CABG  . Thyroid disease Mother   . Cancer Mother 33  uterine cancer, treated, then liver with mets later  . Other Mother        lumbar DDD  . Arthritis Father   . Diabetes Father   . Varicose Veins Father   . Anemia Sister   . Depression Sister   . Arthritis Paternal Grandmother   . Heart disease Paternal Grandmother   . Cancer Maternal Grandmother        pancreatic  . Stroke Neg Hx      Current Outpatient Medications:  .  cetirizine (ZYRTEC) 10 MG tablet, TAKE 1 TABLET (10 MG TOTAL) BY MOUTH DAILY., Disp: 90 tablet, Rfl: 3 .  cholecalciferol (VITAMIN D3) 25 MCG (1000 UT) tablet, Take 1 tablet (1,000 Units total) by mouth daily., Disp: 90 tablet, Rfl: 3 .  Norethindrone Acetate-Ethinyl Estrad-FE (BLISOVI 24 FE) 1-20 MG-MCG(24) tablet, Take 1 tablet by mouth daily., Disp: 1 Package, Rfl: 11 .  BLISOVI 24 FE 1-20 MG-MCG(24) tablet, TAKE 1 TABLET BY  MOUTH EVERY DAY (Patient not taking: Reported on 11/26/2019), Disp: 84 tablet, Rfl: 3 .  fluconazole (DIFLUCAN) 150 MG tablet, Once, may repeat in 1 week (Patient not taking: Reported on 11/26/2019), Disp: 2 tablet, Rfl: 0  Allergies  Allergen Reactions  . Amoxicillin Hives  . Cephalosporins Itching, Swelling and Hives  . Mirena [Levonorgestrel]     Recurrent BV, bleeding  . Penicillins Itching, Swelling and Other (See Comments)    Patient states that she can't move.    Review of Systems Constitutional: -fever, -chills, -sweats, -unexpected weight change, -decreased appetite, -fatigue Allergy: -sneezing, -itching, -congestion Dermatology: -changing moles, --rash  ENT: -runny nose, -ear pain, -sore throat, -hoarseness, -sinus pain, -teeth pain, - ringing in ears, -hearing loss, -nosebleeds Cardiology: -chest pain, -palpitations, -swelling, -difficulty breathing when lying flat, -waking up short of breath Respiratory: -cough, -shortness of breath, -difficulty breathing with exercise or exertion, -wheezing, -coughing up blood Gastroenterology: -abdominal pain, -nausea, -vomiting, -diarrhea, -constipation, -blood in stool, -changes in bowel movement, -difficulty swallowing or eating Hematology: -bleeding, -bruising  Musculoskeletal: -joint aches, -muscle aches, -joint swelling, -back pain, -neck pain, -cramping, -changes in gait Ophthalmology: denies vision changes, eye redness, itching, discharge Urology: -burning with urination, -difficulty urinating, -blood in urine, -urinary frequency, -urgency, -incontinence Neurology: -headache, -weakness, -tingling, -numbness, -memory loss, -falls, -dizziness Psychology: -depressed mood, -agitation, -sleep problems     Objective:   Physical Exam  BP 112/72   Pulse 79   Temp 98.5 F (36.9 C)   Ht 5\' 4"  (1.626 m)   Wt 150 lb 3.2 oz (68.1 kg)   SpO2 97%   BMI 25.78 kg/m   General appearance: alert, no distress, WD/WN, lean AA female Skin: few  scattered benign appearing macules, no worrisome lesions HEENT: normocephalic, conjunctiva/corneas normal, sclerae anicteric, PERRLA, EOMi, nares patent, no discharge or erythema, pharynx normal Oral cavity: MMM, tongue normal, teeth in good repair Neck: supple, no lymphadenopathy, no thyromegaly, no masses, normal ROM, no bruits Chest: non tender, normal shape and expansion Heart: RRR, normal S1, S2, no murmurs Lungs: CTA bilaterally, no wheezes, rhonchi, or rales Abdomen: +bs, soft, umbilical surgical scar, otherwise non tender, non distended, no masses, no hepatomegaly, no splenomegaly, no bruits Back: non tender, normal ROM, no scoliosis Musculoskeletal: upper extremities non tender, no obvious deformity, normal ROM throughout, lower extremities non tender, no obvious deformity, normal ROM throughout Extremities: no edema, no cyanosis, no clubbing Pulses: 2+ symmetric, upper and lower extremities, normal cap refill Neurological: alert, oriented x 3, CN2-12 intact, strength normal upper extremities  and lower extremities, sensation normal throughout, DTRs 2+ throughout, no cerebellar signs, gait normal Psychiatric: normal affect, behavior normal, pleasant  Breast: no mass, skin change, surgical scars inferior to breasts Gyn: Normal external genitalia without lesions, vagina with normal mucosa, cervix without lesions, no cervical motion tenderness, no abnormal vaginal discharge.  Uterus and adnexa not enlarged, nontender, no masses.  Exam chaperoned by nurse. Rectal: deferred   EKG Indication physical and screening for heart disease, family history of premature CAD in mom, rate 62 bpm, PR 158 ms, QRS 82 ms, QTC 432 ms, axis 86 degrees, normal sinus rhythm Normal EKG    Assessment and Plan :    Encounter Diagnoses  Name Primary?  . Encounter for health maintenance examination in adult Yes  . Encounter for surveillance of contraceptives, unspecified contraceptive   . Chronic allergic  rhinitis   . Screen for STD (sexually transmitted disease)   . Family history of uterine cancer   . Screening for cervical cancer   . Screening for heart disease   . Family history of premature coronary artery disease   . Need for Td vaccine     Physical exam - discussed healthy lifestyle, diet, exercise, preventative care, vaccinations, and addressed their concerns.   C/t mammogram q1-2 year, pap sent today, STD labs today See your eye doctor yearly for routine vision care. See your dentist yearly for routine dental care including hygiene visits twice yearly.  Contraception management - discussed risks/benefits of medication,did fine on this medication prior.   Discussed safe sex, prevention.  C/t current OCPs.  Vaccines: Advised covid vaccine, yearly flu vaccine.  Counseled on the Td (tetanus, diptheria) vaccine.  Vaccine information sheet given. Td vaccine given after consent obtained.   Follow-up pending labs  Crystelle was seen today for annual exam.  Diagnoses and all orders for this visit:  Encounter for health maintenance examination in adult -     Comprehensive metabolic panel -     CBC -     Lipid panel -     HIV Antibody (routine testing w rflx) -     RPR -     Cytology - PAP([place]) -     EKG 12-Lead  Encounter for surveillance of contraceptives, unspecified contraceptive -     POCT urine pregnancy  Chronic allergic rhinitis  Screen for STD (sexually transmitted disease) -     HIV Antibody (routine testing w rflx) -     RPR -     Cytology - PAP([place])  Family history of uterine cancer  Screening for cervical cancer -     Cytology - PAP([place])  Screening for heart disease -     EKG 12-Lead  Family history of premature coronary artery disease -     EKG 12-Lead  Need for Td vaccine  Other orders -     Td : Tetanus/diphtheria >7yo Preservative  free

## 2019-11-27 LAB — LIPID PANEL
Chol/HDL Ratio: 2.9 ratio (ref 0.0–4.4)
Cholesterol, Total: 194 mg/dL (ref 100–199)
HDL: 66 mg/dL (ref >39)
LDL Chol Calc (NIH): 113 mg/dL — ABNORMAL HIGH (ref 0–99)
Triglycerides: 82 mg/dL (ref 0–149)
VLDL Cholesterol Cal: 15 mg/dL (ref 5–40)

## 2019-11-27 LAB — CBC
Hematocrit: 38.5 % (ref 34.0–46.6)
Hemoglobin: 12.8 g/dL (ref 11.1–15.9)
MCH: 30 pg (ref 26.6–33.0)
MCHC: 33.2 g/dL (ref 31.5–35.7)
MCV: 90 fL (ref 79–97)
Platelets: 354 10*3/uL (ref 150–450)
RBC: 4.26 x10E6/uL (ref 3.77–5.28)
RDW: 13.5 % (ref 11.7–15.4)
WBC: 7.7 10*3/uL (ref 3.4–10.8)

## 2019-11-27 LAB — HIV ANTIBODY (ROUTINE TESTING W REFLEX): HIV Screen 4th Generation wRfx: NONREACTIVE

## 2019-11-27 LAB — COMPREHENSIVE METABOLIC PANEL
ALT: 12 IU/L (ref 0–32)
AST: 16 IU/L (ref 0–40)
Albumin/Globulin Ratio: 1.7 (ref 1.2–2.2)
Albumin: 4.3 g/dL (ref 3.8–4.8)
Alkaline Phosphatase: 60 IU/L (ref 39–117)
BUN/Creatinine Ratio: 11 (ref 9–23)
BUN: 9 mg/dL (ref 6–24)
Bilirubin Total: 0.4 mg/dL (ref 0.0–1.2)
CO2: 20 mmol/L (ref 20–29)
Calcium: 9.6 mg/dL (ref 8.7–10.2)
Chloride: 105 mmol/L (ref 96–106)
Creatinine, Ser: 0.84 mg/dL (ref 0.57–1.00)
GFR calc Af Amer: 99 mL/min/{1.73_m2} (ref >59)
GFR calc non Af Amer: 86 mL/min/{1.73_m2} (ref >59)
Globulin, Total: 2.6 g/dL (ref 1.5–4.5)
Glucose: 87 mg/dL (ref 65–99)
Potassium: 4.6 mmol/L (ref 3.5–5.2)
Sodium: 140 mmol/L (ref 134–144)
Total Protein: 6.9 g/dL (ref 6.0–8.5)

## 2019-11-27 LAB — RPR: RPR Ser Ql: NONREACTIVE

## 2019-11-28 LAB — CYTOLOGY - PAP
Chlamydia: NEGATIVE
Comment: NEGATIVE
Comment: NEGATIVE
Comment: NORMAL
Diagnosis: NEGATIVE
High risk HPV: NEGATIVE
Neisseria Gonorrhea: NEGATIVE

## 2019-11-29 ENCOUNTER — Other Ambulatory Visit: Payer: Self-pay | Admitting: Medical

## 2019-11-29 MED ORDER — FLUCONAZOLE 150 MG PO TABS: ORAL_TABLET | ORAL | 0 refills | Status: DC

## 2019-11-29 MED ORDER — CETIRIZINE HCL 10 MG PO TABS: ORAL_TABLET | ORAL | 3 refills | Status: DC

## 2019-11-29 MED ORDER — BLISOVI 24 FE 1-20 MG-MCG(24) PO TABS: 1.0000 | ORAL_TABLET | Freq: Every day | ORAL | 11 refills | Status: DC

## 2019-11-29 MED ORDER — VITAMIN D 25 MCG (1000 UNIT) PO TABS: 1000.0000 [IU] | ORAL_TABLET | Freq: Every day | ORAL | 3 refills | Status: DC

## 2019-12-12 ENCOUNTER — Ambulatory Visit
Admission: RE | Admit: 2019-12-12 | Discharge: 2019-12-12 | Disposition: A | Discharge status: Home / Self Care | Payer: Medicaid Other | Source: Ambulatory Visit | Attending: Medical | Admitting: Medical

## 2019-12-12 ENCOUNTER — Other Ambulatory Visit: Payer: Self-pay

## 2019-12-12 DIAGNOSIS — Z1231 Encounter for screening mammogram for malignant neoplasm of breast: Secondary | ICD-10-CM

## 2020-10-07 DIAGNOSIS — Z20822 Contact with and (suspected) exposure to covid-19: Secondary | ICD-10-CM | POA: Diagnosis not present

## 2020-10-28 ENCOUNTER — Other Ambulatory Visit: Payer: Self-pay | Admitting: Medical

## 2020-10-28 DIAGNOSIS — Z1231 Encounter for screening mammogram for malignant neoplasm of breast: Secondary | ICD-10-CM

## 2020-12-18 ENCOUNTER — Other Ambulatory Visit: Payer: Self-pay

## 2020-12-18 ENCOUNTER — Other Ambulatory Visit: Payer: Self-pay | Admitting: Medical

## 2020-12-18 ENCOUNTER — Ambulatory Visit
Admission: RE | Admit: 2020-12-18 | Discharge: 2020-12-18 | Disposition: A | Discharge status: Home / Self Care | Payer: Medicaid Other | Source: Ambulatory Visit | Attending: Medical | Admitting: Medical

## 2020-12-18 DIAGNOSIS — Z1231 Encounter for screening mammogram for malignant neoplasm of breast: Secondary | ICD-10-CM

## 2020-12-18 LAB — HM MAMMOGRAPHY

## 2021-02-11 ENCOUNTER — Telehealth: Payer: Self-pay | Admitting: Medical

## 2021-02-11 ENCOUNTER — Ambulatory Visit: Payer: Medicaid Other | Admitting: Medical

## 2021-02-11 ENCOUNTER — Other Ambulatory Visit: Payer: Self-pay

## 2021-02-11 ENCOUNTER — Encounter: Payer: Self-pay | Admitting: Medical

## 2021-02-11 VITALS — BP 112/76 | HR 61 | Ht 62.0 in | Wt 147.0 lb

## 2021-02-11 DIAGNOSIS — R109 Unspecified abdominal pain: Secondary | ICD-10-CM | POA: Diagnosis not present

## 2021-02-11 DIAGNOSIS — A059 Bacterial foodborne intoxication, unspecified: Secondary | ICD-10-CM | POA: Diagnosis not present

## 2021-02-11 MED ORDER — ALBENDAZOLE 200 MG PO TABS: 400.0000 mg | ORAL_TABLET | Freq: Every day | ORAL | 0 refills | Status: DC

## 2021-02-11 NOTE — Telephone Encounter (Signed)
Please call patient back.  Per the infections disease specialist, they recommend stool sample for ova and parasite.  Please have Byrd Hesselbach let you know where patient can drop this sample off.  They recommended testing on 3 different stool samples if possible.  Also, if she sees another worm, take a picture which may help [place] in the diagnosis  They recommended against treatment until we get ova and parasite test results.  If not diarrhea, they recommended no stool culture at this time.   Have her to the O&P test which takes a few days to result

## 2021-02-11 NOTE — Progress Notes (Signed)
Subjective:  [name] is a 45 y.o. female who presents for Chief Complaint  Patient presents with  . stool problem     Possible worm in stool      Here for concern for possible parasite.   A few days ago she saw a long white thing in the stool, looked like a worm.  Happened again yesterday.  She notes some bloating, abdominal discomfort.  She notes 10 days ago she had what seemed like food poisoning.   Had 2-3 days of loose stools.   Thinks she ate some bad crab dip.  Things seemed to resolve.  No recent camping or travel.  No new animal exposure.   No other uncooked foods.  No consumption of food sitting out for long periods. No other aggravating or relieving factors.    No other c/o.  The following portions of the patient's history were reviewed and updated as appropriate: allergies, current medications, past family history, past medical history, past social history, past surgical history and problem list.  Past Medical History:  Diagnosis Date  . Contraceptive management   . Herpes genitalis in women   . Seasonal allergic rhinitis   . Sinusitis    history of, worse as teenager  . Wears glasses    Current Outpatient Medications on File Prior to Visit  Medication Sig Dispense Refill  . cetirizine (ZYRTEC) 10 MG tablet TAKE 1 TABLET (10 MG TOTAL) BY MOUTH DAILY. 90 tablet 3  . cholecalciferol (VITAMIN D3) 25 MCG (1000 UNIT) tablet Take 1 tablet (1,000 Units total) by mouth daily. (Patient not taking: Reported on 02/11/2021) 90 tablet 3  . fluconazole (DIFLUCAN) 150 MG tablet Once, may repeat in 1 week (Patient not taking: Reported on 02/11/2021) 2 tablet 0  . Norethindrone Acetate-Ethinyl Estrad-FE (BLISOVI 24 FE) 1-20 MG-MCG(24) tablet Take 1 tablet by mouth daily. (Patient not taking: Reported on 02/11/2021) 1 Package 11   No current facility-administered medications on file prior to visit.    ROS Otherwise as in subjective above   Objective: BP 112/76   Pulse 61   Ht 5'  2" (1.575 m)   Wt 147 lb (66.7 kg)   SpO2 98%   BMI 26.89 kg/m   General appearance: alert, no distress, well developed, well nourished Abdomen: +bs, soft, nontender, no mass, no organomegaly   Assessment: Encounter Diagnoses  Name Primary?  . Abdominal discomfort Yes  . Food contamination      Plan: We discussed concerns, possible causes.  Possible food poisoning or contamination.   Exposures would include recent crab dip, and has 4 cats at home.  No other concern for exposure.  We discussed possibly using Albendazole as below.     I sent an electronic E consult for specialty provider review through [facility] on their behalf regarding symptoms, parasite concern, treatment.  I will get back in touch with patient pending recommendations from the [facility] consult.  I sent her home with collection containers in case we pursue O&P and stool culture tests.  Warren was seen today for stool problem .  Diagnoses and all orders for this visit:  Abdominal discomfort  Food contamination  Other orders -     albendazole (ALBENZA) 200 MG tablet; Take 2 tablets (400 mg total) by mouth daily. X 2 days, repeat in 2 weeks    Follow up: pending call back

## 2021-02-12 NOTE — Telephone Encounter (Signed)
Pt does not have diarrhea and will take picture of worm is she see's again. Pt was informed of provider message

## 2021-02-16 ENCOUNTER — Other Ambulatory Visit: Payer: Self-pay | Admitting: Medical

## 2021-02-16 DIAGNOSIS — R195 Other fecal abnormalities: Secondary | ICD-10-CM

## 2021-02-16 DIAGNOSIS — B839 Helminthiasis, unspecified: Secondary | ICD-10-CM

## 2021-02-16 NOTE — Telephone Encounter (Signed)
Per prior message, I assume she is going to turn in stool samples for ova and parasite, correct?

## 2021-02-16 NOTE — Telephone Encounter (Signed)
Spoke to patient and she will come into the office to pick up kit for stool test. Please place the order for this

## 2021-02-16 NOTE — Telephone Encounter (Signed)
Order for O&P is in

## 2021-02-16 NOTE — Progress Notes (Signed)
Ova

## 2021-02-17 ENCOUNTER — Other Ambulatory Visit: Payer: Self-pay

## 2021-02-17 ENCOUNTER — Other Ambulatory Visit: Payer: Medicaid Other

## 2021-02-17 DIAGNOSIS — R195 Other fecal abnormalities: Secondary | ICD-10-CM

## 2021-02-17 DIAGNOSIS — B839 Helminthiasis, unspecified: Secondary | ICD-10-CM

## 2021-02-23 LAB — OVA AND PARASITE EXAMINATION

## 2021-05-26 ENCOUNTER — Encounter: Payer: Medicaid Other | Admitting: Medical

## 2021-06-03 ENCOUNTER — Encounter: Payer: Self-pay | Admitting: Medical

## 2021-06-03 ENCOUNTER — Ambulatory Visit (INDEPENDENT_AMBULATORY_CARE_PROVIDER_SITE_OTHER): Payer: Medicaid Other | Admitting: Medical

## 2021-06-03 ENCOUNTER — Other Ambulatory Visit: Payer: Self-pay

## 2021-06-03 VITALS — BP 120/70 | HR 72 | Ht 62.5 in | Wt 149.2 lb

## 2021-06-03 DIAGNOSIS — K5909 Other constipation: Secondary | ICD-10-CM | POA: Diagnosis not present

## 2021-06-03 DIAGNOSIS — Z113 Encounter for screening for infections with a predominantly sexual mode of transmission: Secondary | ICD-10-CM

## 2021-06-03 DIAGNOSIS — Z8249 Family history of ischemic heart disease and other diseases of the circulatory system: Secondary | ICD-10-CM | POA: Diagnosis not present

## 2021-06-03 DIAGNOSIS — G479 Sleep disorder, unspecified: Secondary | ICD-10-CM

## 2021-06-03 DIAGNOSIS — R5383 Other fatigue: Secondary | ICD-10-CM | POA: Diagnosis not present

## 2021-06-03 DIAGNOSIS — Z0000 Encounter for general adult medical examination without abnormal findings: Secondary | ICD-10-CM | POA: Diagnosis not present

## 2021-06-03 DIAGNOSIS — J309 Allergic rhinitis, unspecified: Secondary | ICD-10-CM

## 2021-06-03 DIAGNOSIS — Z8049 Family history of malignant neoplasm of other genital organs: Secondary | ICD-10-CM | POA: Diagnosis not present

## 2021-06-03 DIAGNOSIS — E559 Vitamin D deficiency, unspecified: Secondary | ICD-10-CM | POA: Diagnosis not present

## 2021-06-03 DIAGNOSIS — Z23 Encounter for immunization: Secondary | ICD-10-CM | POA: Diagnosis not present

## 2021-06-03 NOTE — Progress Notes (Signed)
Subjective: Chief Complaint  Patient presents with   fasting cpe    Fasting cpe. No concerns flu shot given today. Pap done last year    Here for physical.  Doing fine in general.  No desired for contraception currently  Constipation occasionally, hx/o irregular bowels, but no particular concern.  Has learned to manage it  Is a light sleeper, wakes up too easily, but still rested when she gets up  Reviewed their medical, surgical, family, social, medication, and allergy history and updated chart as appropriate.  Past Medical History:  Diagnosis Date   Contraceptive management    Herpes genitalis in women    Seasonal allergic rhinitis    Sinusitis    history of, worse as teenager   Wears glasses     Past Surgical History:  Procedure Laterality Date   AUGMENTATION MAMMAPLASTY Bilateral 07/03/2017   BREAST ENHANCEMENT SURGERY  07/09/2016   [facility] medical school   CESAREAN SECTION     twice   COSMETIC SURGERY  06/2016   tummy tuck, [facility]   HERNIA REPAIR  2014   umbilical    Social History   Socioeconomic History   Marital status: Single    Spouse name: Not on file   Number of children: 2   Years of education: Not on file   Highest education level: Not on file  Occupational History   Occupation: territory sales Barista)  Tobacco Use   Smoking status: Never   Smokeless tobacco: Never  Vaping Use   Vaping Use: Never used  Substance and Sexual Activity   Alcohol use: Yes    Comment: occasionally   Drug use: No   Sexual activity: Not Currently    Birth control/protection: Condom  Other Topics Concern   Not on file  Social History Narrative   Started [employer] business 2019.  Exercise - some.  Have 2 children.   Girls play travel volleyball.   05/2021   Social Determinants of Health   Financial Resource Strain: Not on file  Food Insecurity: Not on file  Transportation Needs: Not on file  Physical Activity: Not on file  Stress:  Not on file  Social Connections: Not on file  Intimate Partner Violence: Not on file    Family History  Problem Relation Age of Onset   Hypertension Mother    Heart disease Mother 56       MI, CHF, CABG   Thyroid disease Mother    Cancer Mother 39       uterine cancer, treated, then liver with mets later   Other Mother        lumbar DDD   Arthritis Father    Diabetes Father    Varicose Veins Father    Anemia Sister    Depression Sister    Arthritis Paternal Grandmother    Heart disease Paternal Grandmother    Cancer Maternal Grandmother        pancreatic   Stroke Neg Hx      Current Outpatient Medications:    cetirizine (ZYRTEC) 10 MG tablet, TAKE 1 TABLET (10 MG TOTAL) BY MOUTH DAILY., Disp: 90 tablet, Rfl: 3   cholecalciferol (VITAMIN D3) 25 MCG (1000 UNIT) tablet, Take 1 tablet (1,000 Units total) by mouth daily., Disp: 90 tablet, Rfl: 3  Allergies  Allergen Reactions   Amoxicillin Hives   Cephalosporins Itching, Swelling and Hives   Mirena [Levonorgestrel]     Recurrent BV, bleeding   Penicillins Itching, Swelling  and Other (See Comments)    Patient states that she can't move.    Review of Systems Constitutional: -fever, -chills, -sweats, -unexpected weight change, -decreased appetite, -fatigue Allergy: -sneezing, -itching, -congestion Dermatology: -changing moles, --rash  ENT: -runny nose, -ear pain, -sore throat, -hoarseness, -sinus pain, -teeth pain, - ringing in ears, -hearing loss, -nosebleeds Cardiology: -chest pain, -palpitations, -swelling, -difficulty breathing when lying flat, -waking up short of breath Respiratory: -cough, -shortness of breath, -difficulty breathing with exercise or exertion, -wheezing, -coughing up blood Gastroenterology: -abdominal pain, -nausea, -vomiting, -diarrhea, -constipation, -blood in stool, -changes in bowel movement, -difficulty swallowing or eating Hematology: -bleeding, -bruising  Musculoskeletal: -joint aches, -muscle  aches, -joint swelling, -back pain, -neck pain, -cramping, -changes in gait Ophthalmology: denies vision changes, eye redness, itching, discharge Urology: -burning with urination, -difficulty urinating, -blood in urine, -urinary frequency, -urgency, -incontinence Neurology: -headache, -weakness, -tingling, -numbness, -memory loss, -falls, -dizziness Psychology: -depressed mood, -agitation, +sleep problems     Objective:   Physical Exam  BP 120/70   Pulse 72   Ht 5' 2.5" (1.588 m)   Wt 149 lb 3.2 oz (67.7 kg)   BMI 26.85 kg/m   General appearance: alert, no distress, WD/WN, lean AA female Skin: few scattered benign appearing macules, no worrisome lesions HEENT: normocephalic, conjunctiva/corneas normal, sclerae anicteric, PERRLA, EOMi, nares patent, no discharge or erythema, pharynx normal Oral cavity: MMM, tongue normal, teeth in good repair Neck: supple, no lymphadenopathy, no thyromegaly, no masses, normal ROM, no bruits Chest: non tender, normal shape and expansion Heart: RRR, normal S1, S2, no murmurs Lungs: CTA bilaterally, no wheezes, rhonchi, or rales Abdomen: +bs, soft, umbilical surgical scar, otherwise non tender, non distended, no masses, no hepatomegaly, no splenomegaly, no bruits Back: non tender, normal ROM, no scoliosis Musculoskeletal: upper extremities non tender, no obvious deformity, normal ROM throughout, lower extremities non tender, no obvious deformity, normal ROM throughout Extremities: no edema, no cyanosis, no clubbing Pulses: 2+ symmetric, upper and lower extremities, normal cap refill Neurological: alert, oriented x 3, CN2-12 intact, strength normal upper extremities and lower extremities, sensation normal throughout, DTRs 2+ throughout, no cerebellar signs, gait normal Psychiatric: normal affect, behavior normal, pleasant  Breast: deferred Gyn: Normal external genitalia without lesions, vagina with normal mucosa, cervix without lesions, no cervical motion  tenderness, no abnormal vaginal discharge.  Uterus and adnexa not enlarged, nontender, no masses.  Exam chaperoned by nurse. Rectal: deferred    Assessment and Plan :    Encounter Diagnoses  Name Primary?   Encounter for health maintenance examination in adult Yes   Family history of uterine cancer    Family history of premature coronary artery disease    Chronic constipation    Chronic allergic rhinitis    Screen for STD (sexually transmitted disease)    Sleep disturbances     Physical exam - discussed healthy lifestyle, diet, exercise, preventative care, vaccinations, and addressed their concerns.   C/t mammogram q1-2 year, pap up to date, STD labs today See your eye doctor yearly for routine vision care. See your dentist yearly for routine dental care including hygiene visits twice yearly.  Advise safe sex, prevention, condom use  Vaccines: Advised covid vaccine, yearly flu vaccine.  Counseled on the influenza virus vaccine.  Vaccine information sheet given.  Influenza vaccine given after consent obtained.   Chronic constipation -no recent changes.  Consider GI consult, but she declines for now.  Discussed need for colon cancer screening 2023.   Sleep disturbance - discussed sleep hygiene.  No significant indication for sleep eval at this time.  Discussed cardiac screening given mother's history.  EKG normal last year.  Discussed coronary CT test as screening option.   Follow-up pending labs  Jaena was seen today for fasting cpe.  Diagnoses and all orders for this visit:  Encounter for health maintenance examination in adult -     Basic metabolic panel -     CBC -     Lipid panel -     VITAMIN D 25 Hydroxy (Vit-D Deficiency, Fractures) -     RPR+HIV+GC+CT Panel -     Hepatitis C antibody -     Hepatitis B surface antigen  Family history of uterine cancer  Family history of premature coronary artery disease  Chronic constipation  Chronic allergic  rhinitis  Screen for STD (sexually transmitted disease) -     RPR+HIV+GC+CT Panel -     Hepatitis C antibody -     Hepatitis B surface antigen  Sleep disturbances     F/u pending labs

## 2021-06-03 NOTE — Addendum Note (Signed)
Addended by: Herminio Commons A on: 06/03/2021 10:22 AM   Modules accepted: Orders

## 2021-06-06 ENCOUNTER — Other Ambulatory Visit: Payer: Self-pay | Admitting: Medical

## 2021-06-06 LAB — BASIC METABOLIC PANEL
BUN/Creatinine Ratio: 15 (ref 9–23)
BUN: 10 mg/dL (ref 6–24)
CO2: 22 mmol/L (ref 20–29)
Calcium: 9.1 mg/dL (ref 8.7–10.2)
Chloride: 102 mmol/L (ref 96–106)
Creatinine, Ser: 0.66 mg/dL (ref 0.57–1.00)
Glucose: 81 mg/dL (ref 65–99)
Potassium: 4.6 mmol/L (ref 3.5–5.2)
Sodium: 138 mmol/L (ref 134–144)
eGFR: 111 mL/min/{1.73_m2} (ref >59)

## 2021-06-06 LAB — LIPID PANEL
Chol/HDL Ratio: 2.4 ratio (ref 0.0–4.4)
Cholesterol, Total: 197 mg/dL (ref 100–199)
HDL: 81 mg/dL (ref >39)
LDL Chol Calc (NIH): 106 mg/dL — ABNORMAL HIGH (ref 0–99)
Triglycerides: 52 mg/dL (ref 0–149)
VLDL Cholesterol Cal: 10 mg/dL (ref 5–40)

## 2021-06-06 LAB — RPR+HIV+GC+CT PANEL
Chlamydia trachomatis, NAA: NEGATIVE
HIV Screen 4th Generation wRfx: NONREACTIVE
Neisseria Gonorrhoeae by PCR: NEGATIVE
RPR Ser Ql: NONREACTIVE

## 2021-06-06 LAB — CBC
Hematocrit: 35.9 % (ref 34.0–46.6)
Hemoglobin: 12.4 g/dL (ref 11.1–15.9)
MCH: 30.1 pg (ref 26.6–33.0)
MCHC: 34.5 g/dL (ref 31.5–35.7)
MCV: 87 fL (ref 79–97)
Platelets: 376 10*3/uL (ref 150–450)
RBC: 4.12 x10E6/uL (ref 3.77–5.28)
RDW: 13.7 % (ref 11.7–15.4)
WBC: 9.2 10*3/uL (ref 3.4–10.8)

## 2021-06-06 LAB — HEPATITIS B SURFACE ANTIGEN: Hepatitis B Surface Ag: NEGATIVE

## 2021-06-06 LAB — VITAMIN D 25 HYDROXY (VIT D DEFICIENCY, FRACTURES): Vit D, 25-Hydroxy: 29.2 ng/mL — ABNORMAL LOW (ref 30.0–100.0)

## 2021-06-06 LAB — HEPATITIS C ANTIBODY: Hep C Virus Ab: <0.1 s/co ratio (ref 0.0–0.9)

## 2021-06-06 MED ORDER — VITAMIN D 50 MCG (2000 UT) PO CAPS: 1.0000 | ORAL_CAPSULE | Freq: Every day | ORAL | 3 refills | Status: DC

## 2021-07-19 ENCOUNTER — Other Ambulatory Visit: Payer: Self-pay | Admitting: Medical

## 2021-11-04 ENCOUNTER — Other Ambulatory Visit: Payer: Self-pay | Admitting: Medical

## 2021-11-04 DIAGNOSIS — Z1231 Encounter for screening mammogram for malignant neoplasm of breast: Secondary | ICD-10-CM

## 2021-12-09 ENCOUNTER — Telehealth: Payer: Medicaid Other | Admitting: Family Medicine

## 2021-12-09 DIAGNOSIS — R300 Dysuria: Secondary | ICD-10-CM | POA: Diagnosis not present

## 2021-12-09 MED ORDER — NITROFURANTOIN MONOHYD MACRO 100 MG PO CAPS: 100.0000 mg | ORAL_CAPSULE | Freq: Two times a day (BID) | ORAL | 0 refills | Status: AC

## 2021-12-09 NOTE — Progress Notes (Signed)

## 2021-12-15 ENCOUNTER — Other Ambulatory Visit: Payer: Self-pay | Admitting: Medical

## 2021-12-20 ENCOUNTER — Ambulatory Visit
Admission: RE | Admit: 2021-12-20 | Discharge: 2021-12-20 | Disposition: A | Discharge status: Home / Self Care | Payer: Medicaid Other | Source: Ambulatory Visit | Attending: Medical | Admitting: Medical

## 2021-12-20 DIAGNOSIS — Z1231 Encounter for screening mammogram for malignant neoplasm of breast: Secondary | ICD-10-CM | POA: Diagnosis not present

## 2022-05-09 ENCOUNTER — Other Ambulatory Visit: Payer: Self-pay | Admitting: Medical

## 2022-05-25 ENCOUNTER — Encounter: Payer: Self-pay | Admitting: Internal Medicine

## 2022-06-02 ENCOUNTER — Other Ambulatory Visit: Payer: Self-pay | Admitting: Medical

## 2022-06-25 ENCOUNTER — Other Ambulatory Visit: Payer: Self-pay | Admitting: Medical

## 2022-06-28 ENCOUNTER — Encounter: Payer: Self-pay | Admitting: Internal Medicine

## 2022-07-14 ENCOUNTER — Ambulatory Visit: Payer: Medicaid Other | Admitting: Medical

## 2022-07-14 VITALS — BP 110/70 | HR 64 | Wt 143.6 lb

## 2022-07-14 DIAGNOSIS — K219 Gastro-esophageal reflux disease without esophagitis: Secondary | ICD-10-CM | POA: Diagnosis not present

## 2022-07-14 DIAGNOSIS — K5909 Other constipation: Secondary | ICD-10-CM | POA: Diagnosis not present

## 2022-07-14 DIAGNOSIS — Z1211 Encounter for screening for malignant neoplasm of colon: Secondary | ICD-10-CM | POA: Diagnosis not present

## 2022-07-14 DIAGNOSIS — R109 Unspecified abdominal pain: Secondary | ICD-10-CM | POA: Insufficient documentation

## 2022-07-14 DIAGNOSIS — Z8719 Personal history of other diseases of the digestive system: Secondary | ICD-10-CM | POA: Diagnosis not present

## 2022-07-14 MED ORDER — TRULANCE 3 MG PO TABS: 3.0000 mg | ORAL_TABLET | Freq: Every day | ORAL | 1 refills | Status: DC

## 2022-07-14 MED ORDER — OMEPRAZOLE 40 MG PO CPDR: 40.0000 mg | DELAYED_RELEASE_CAPSULE | Freq: Every day | ORAL | 0 refills | Status: DC

## 2022-07-14 NOTE — Progress Notes (Signed)
Subjective:  [name] is a 46 y.o. female who presents for Chief Complaint  Patient presents with   hernia    Hernia- pain in stomach area  for a couple days, had hernias in the past     Here for possible hernia.  She notes a history of umbilical hernia repair in the past.  She notes that she has a pain in a fairly specific spot of her upper abdomen on and off recently.  When she coughs or leans down or sneezes she can get pain.  No obvious bulge.  She also has a history of chronic constipation for every month since is a little child.  She has failed MiraLAX, Amitiza and over-the-counter remedies.  Some days she has a bowel movement some weeks she can go 3 or 4 days or more before bowel movement.  She also gets some reflux from food and some belching.  Occasional heartburn.  No prior GI consult.  No other aggravating or relieving factors.    No other c/o.  Past Medical History:  Diagnosis Date   Contraceptive management    Herpes genitalis in women    Seasonal allergic rhinitis    Sinusitis    history of, worse as teenager   Wears glasses    Past Surgical History:  Procedure Laterality Date   AUGMENTATION MAMMAPLASTY Bilateral 07/03/2017   BREAST ENHANCEMENT SURGERY  07/09/2016   [facility] medical school   CESAREAN SECTION     twice   COSMETIC SURGERY  06/2016   tummy tuck, [facility]   HERNIA REPAIR  2014   umbilical   The following portions of the patient's history were reviewed and updated as appropriate: allergies, current medications, past family history, past medical history, past social history, past surgical history and problem list.  ROS Otherwise as in subjective above    Objective: BP 110/70   Pulse 64   Wt 143 lb 9.6 oz (65.1 kg)   BMI 25.85 kg/m   General appearance: alert, no distress, well developed, well nourished Abdomen: +bs, soft, there is a small bulge at the umbilicus that is reducible and nontender.  She is tender in the  epigastric region above the umbilicus approximately midway between the xiphoid process and umbilicus but no obvious bulge.  No obvious diastases recti either.  Otherwise non tender, non distended, no masses, no hepatomegaly, no splenomegaly    Assessment: Encounter Diagnoses  Name Primary?   Abdominal discomfort Yes   History of umbilical hernia    Gastroesophageal reflux disease, unspecified whether esophagitis present    Chronic constipation    Screen for colon cancer      Plan: Abdominal discomfort, history of umbilical hernia-it is unclear if there is a hernia present or not.  It is not obvious on exam.  She wants to rule this out.  CT scan ordered.  Chronic constipation-begin trial of Trulance.  Samples and prescription given.  She has failed MiraLAX, Amitiza, over-the-counter stool softeners and laxatives.  GERD-begin trial of omeprazole.  Discussed proper use of medication.  Avoid or limit GERD triggers.  She does tend to eat a lot of acidic foods.  Screen for colon cancer-referral to GI for issues above and screening for colon cancer   [name] was seen today for hernia.  Diagnoses and all orders for this visit:  Abdominal discomfort -     CT ABDOMEN W CONTRAST; Future -     Basic metabolic panel  History of umbilical  hernia  Gastroesophageal reflux disease, unspecified whether esophagitis present  Chronic constipation -     Ambulatory referral to Gastroenterology  Screen for colon cancer -     Ambulatory referral to Gastroenterology  Other orders -     omeprazole (PRILOSEC) 40 MG capsule; Take 1 capsule (40 mg total) by mouth daily. -     Plecanatide (TRULANCE) 3 MG TABS; Take 3 mg by mouth daily.    Follow up: pending referral, scan

## 2022-07-15 LAB — BASIC METABOLIC PANEL
BUN/Creatinine Ratio: 16 (ref 9–23)
BUN: 12 mg/dL (ref 6–24)
CO2: 23 mmol/L (ref 20–29)
Calcium: 9.4 mg/dL (ref 8.7–10.2)
Chloride: 105 mmol/L (ref 96–106)
Creatinine, Ser: 0.77 mg/dL (ref 0.57–1.00)
Glucose: 104 mg/dL — ABNORMAL HIGH (ref 70–99)
Potassium: 4.4 mmol/L (ref 3.5–5.2)
Sodium: 141 mmol/L (ref 134–144)
eGFR: 97 mL/min/{1.73_m2} (ref >59)

## 2022-07-22 ENCOUNTER — Other Ambulatory Visit: Payer: Medicaid Other

## 2022-07-23 ENCOUNTER — Telehealth: Payer: Self-pay | Admitting: Medical

## 2022-07-23 NOTE — Telephone Encounter (Signed)
P.A. TRULANCE  

## 2022-07-27 ENCOUNTER — Encounter: Payer: Self-pay | Admitting: Medical

## 2022-07-27 DIAGNOSIS — Z1211 Encounter for screening for malignant neoplasm of colon: Secondary | ICD-10-CM | POA: Diagnosis not present

## 2022-07-27 DIAGNOSIS — K5900 Constipation, unspecified: Secondary | ICD-10-CM | POA: Diagnosis not present

## 2022-07-28 ENCOUNTER — Ambulatory Visit
Admission: RE | Admit: 2022-07-28 | Discharge: 2022-07-28 | Disposition: A | Discharge status: Home / Self Care | Payer: Medicaid Other | Source: Ambulatory Visit | Attending: Medical | Admitting: Medical

## 2022-07-28 DIAGNOSIS — R109 Unspecified abdominal pain: Secondary | ICD-10-CM

## 2022-07-28 DIAGNOSIS — I517 Cardiomegaly: Secondary | ICD-10-CM | POA: Diagnosis not present

## 2022-07-28 DIAGNOSIS — R1013 Epigastric pain: Secondary | ICD-10-CM | POA: Diagnosis not present

## 2022-07-28 DIAGNOSIS — E279 Disorder of adrenal gland, unspecified: Secondary | ICD-10-CM | POA: Diagnosis not present

## 2022-07-28 DIAGNOSIS — D1800 Hemangioma unspecified site: Secondary | ICD-10-CM | POA: Diagnosis not present

## 2022-07-28 MED ORDER — IOPAMIDOL (ISOVUE-300) INJECTION 61%
100.0000 mL | Freq: Once | INTRAVENOUS | Status: AC | PRN
  Administered 2022-07-28: 100 mL via INTRAVENOUS

## 2022-07-29 NOTE — Telephone Encounter (Signed)
P.A. approved til 07/29/23, sent mychart message

## 2022-08-01 ENCOUNTER — Other Ambulatory Visit: Payer: Self-pay | Admitting: Medical

## 2022-08-01 NOTE — Telephone Encounter (Signed)
Pt has upcoming appt soon 

## 2022-08-04 ENCOUNTER — Ambulatory Visit: Payer: Medicaid Other | Admitting: Medical

## 2022-08-04 ENCOUNTER — Encounter: Payer: Self-pay | Admitting: Medical

## 2022-08-04 VITALS — BP 110/62 | HR 75 | Wt 143.2 lb

## 2022-08-04 DIAGNOSIS — Z8249 Family history of ischemic heart disease and other diseases of the circulatory system: Secondary | ICD-10-CM | POA: Diagnosis not present

## 2022-08-04 DIAGNOSIS — E279 Disorder of adrenal gland, unspecified: Secondary | ICD-10-CM

## 2022-08-04 DIAGNOSIS — N2889 Other specified disorders of kidney and ureter: Secondary | ICD-10-CM

## 2022-08-04 DIAGNOSIS — R935 Abnormal findings on diagnostic imaging of other abdominal regions, including retroperitoneum: Secondary | ICD-10-CM

## 2022-08-04 NOTE — Progress Notes (Signed)
Cardiology Office Note   Date:  08/05/2022   ID:  [name], DOB 08/31/1976, MRN [id number]  PCP:  Melody Canavan, PA-C  Cardiologist:   None Referring:  Melody Campi Kermit Balo, PA-C  Chief Complaint  Patient presents with   Cardiomegally      History of Present Illness: [name] is a 46 y.o. female who presents for evaluation of cardiomegaly that was found on CT.  She had this done to evaluate some abdominal discomfort she was having as she has had umbilical hernia repair in the past.  There was a mention of cardiomegaly.  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  However, because of her family history and this finding she was referred to me.  She has never had any prior cardiac history.  She has been physically active.  She walks for exercise.  She does not describe any chest pressure, neck or arm discomfort.  She does not have any shortness of breath, PND or orthopnea.  She has never had any palpitations, presyncope or syncope.   Past Medical History:  Diagnosis Date   Contraceptive management    Herpes genitalis in women    Seasonal allergic rhinitis    Sinusitis    history of, worse as teenager   Wears glasses     Past Surgical History:  Procedure Laterality Date   AUGMENTATION MAMMAPLASTY Bilateral 07/03/2017   BREAST ENHANCEMENT SURGERY  07/09/2016   [facility] medical school   CESAREAN SECTION     twice   COSMETIC SURGERY  06/2016   tummy tuck, [facility]   HERNIA REPAIR  2014   umbilical     Current Outpatient Medications  Medication Sig Dispense Refill   Cholecalciferol (VITAMIN D3) 50 MCG (2000 UT) capsule TAKE 1 CAPSULE BY MOUTH EVERY DAY 90 capsule 0   omeprazole (PRILOSEC) 40 MG capsule Take 1 capsule (40 mg total) by mouth daily. 30 capsule 0   Plecanatide (TRULANCE) 3 MG TABS Take 3 mg by mouth daily. 30 tablet 1    No current facility-administered medications for this visit.    Allergies:   Amoxicillin, Cephalosporins, Mirena [levonorgestrel], and Penicillins    Social History:  The patient  reports that she has never smoked. She has never used smokeless tobacco. She reports current alcohol use. She reports that she does not use drugs.   Family History:  The patient's family history includes Anemia in her sister; Arthritis in her father and paternal grandmother; Cancer in her maternal grandmother; Cancer (age of onset: 55) in her mother; Depression in her sister; Diabetes in her father; Heart disease in her paternal grandmother; Heart disease (age of onset: 43) in her mother; Hypertension in her mother; Other in her mother; Thyroid disease in her mother; Varicose Veins in her father.    ROS:  Please see the history of present illness.   Otherwise, review of systems are positive for none.   All other systems are reviewed and negative.    PHYSICAL EXAM: VS:  BP 108/78   Pulse 69   Ht 5\' 2"  (1.575 m)   Wt 143 lb (64.9 kg)   SpO2 96%   BMI 26.16 kg/m  , BMI Body mass index is 26.16 kg/m. GENERAL:  Well appearing HEENT:  Pupils equal round and reactive, fundi not visualized, oral mucosa unremarkable NECK:  No jugular venous  distention, waveform within normal limits, carotid upstroke brisk and symmetric, no bruits, no thyromegaly LYMPHATICS:  No cervical, inguinal adenopathy LUNGS:  Clear to auscultation bilaterally BACK:  No CVA tenderness CHEST:  Unremarkable HEART:  PMI not displaced or sustained,S1 and S2 within normal limits, no S3, no S4, no clicks, no rubs, very soft 2 out of 6 short nonradiating systolic murmur heard only at the apex, no diastolic murmurs ABD:  Flat, positive bowel sounds normal in frequency in pitch, no bruits, no rebound, no guarding, no midline pulsatile mass, no hepatomegaly, no splenomegaly EXT:  2 plus pulses throughout, no edema, no cyanosis no clubbing SKIN:  No  rashes no nodules NEURO:  Cranial nerves II through XII grossly intact, motor grossly intact throughout PSYCH:  Cognitively intact, oriented to person place and time    EKG:  EKG is not ordered today. The ekg ordered 08/04/2022 demonstrates sinus rhythm, rate 77, axis within normal limits, intervals within normal limits, no acute ST-T wave changes.   Recent Labs: 07/14/2022: BUN 12; Creatinine, Ser 0.77; Potassium 4.4; Sodium 141    Lipid Panel    Component Value Date/Time   CHOL 197 06/03/2021 1003   TRIG 52 06/03/2021 1003   HDL 81 06/03/2021 1003   CHOLHDL 2.4 06/03/2021 1003   CHOLHDL 2.5 09/28/2017 1330   VLDL 8 09/25/2015 0001   LDLCALC 106 (H) 06/03/2021 1003   LDLCALC 106 (H) 09/28/2017 1330      Wt Readings from Last 3 Encounters:  08/05/22 143 lb (64.9 kg)  08/04/22 143 lb 3.2 oz (65 kg)  07/14/22 143 lb 9.6 oz (65.1 kg)      Other studies Reviewed: Additional studies/ records that were reviewed today include: Labs, primary care notes, previous EKG done by the primary provider. Review of the above records demonstrates:  Please see elsewhere in the note.     ASSESSMENT AND PLAN:  Abnormal CT: There is a question of cardiomegaly.  However, her exam is unrevealing.  She has no symptoms related.  She does have a slight murmur.  Given this and the family history I will screen her with an echocardiogram.  Murmur: I suspect this is a flow murmur but this will be evaluated as above.   Current medicines are reviewed at length with the patient today.  The patient does not have concerns regarding medicines.  The following changes have been made:  no change  Labs/ tests ordered today include:   Orders Placed This Encounter  Procedures   ECHOCARDIOGRAM COMPLETE     Disposition:   FU with me as needed   Signed, Melody Breeding, MD  08/05/2022 11:43 AM    [name]

## 2022-08-04 NOTE — Progress Notes (Signed)
Subjective:  [name] is a 46 y.o. female who presents for Chief Complaint  Patient presents with   needs EKG    Needs EKG and new order for CT     Here for follow-up from recent CT scan.  She had a CT scan 07/31/2022 and due to concern for abdominal discomfort and hernia.  She is here to follow-up on those findings.  She also notes a little bump in her right scalp that she wants me look at.  It is some that she just found the other day.  Not painful.  No drainage.  No swelling.  No other aggravating or relieving factors.    No other c/o.  Past Medical History:  Diagnosis Date   Contraceptive management    Herpes genitalis in women    Seasonal allergic rhinitis    Sinusitis    history of, worse as teenager   Wears glasses    Current Outpatient Medications on File Prior to Visit  Medication Sig Dispense Refill   Cholecalciferol (VITAMIN D3) 50 MCG (2000 UT) capsule TAKE 1 CAPSULE BY MOUTH EVERY DAY 90 capsule 0   omeprazole (PRILOSEC) 40 MG capsule Take 1 capsule (40 mg total) by mouth daily. 30 capsule 0   Plecanatide (TRULANCE) 3 MG TABS Take 3 mg by mouth daily. 30 tablet 1   No current facility-administered medications on file prior to visit.    The following portions of the patient's history were reviewed and updated as appropriate: allergies, current medications, past family history, past medical history, past social history, past surgical history and problem list.  ROS Otherwise as in subjective above  Objective: BP 110/62   Pulse 75   Wt 143 lb 3.2 oz (65 kg)   BMI 25.77 kg/m   General appearance: alert, no distress, well developed, well nourished Right parietal scalp in hairline with <24mm diameter subcutaneous cyst, nontender, no swelling, no induration    Assessment: Encounter Diagnoses  Name Primary?   Lesion of adrenal gland (HCC) Yes   Abnormal CT of the abdomen    Family history of premature CAD      Plan: We discussed her recent CT scan  findings.  We mainly were looking at the scan to rule out hernia.  The assumption is based on her prior history and based on the scan the constipation is the more likely cause of her recent symptoms.  Continue efforts to treat constipation with good water and fiber intake, medications that she has used in the past to help relieve symptoms  The scan did show possible cardiomegaly.  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  Baseline referral today to cardiology given this finding in mother's history of premature coronary artery disease, history of CABG and mother.  Patient is asymptomatic, non-smoker, no other significant risk factors for heart disease  We discussed the adrenal lesion.  The plan will be to do an MRI in 6 months to survey the lesion.  I would rather do an MRI to avoid excess radiation from CT scan  [name] was seen today for needs ekg.  Diagnoses and all orders for this visit:  Lesion of adrenal gland (HCC) -     EKG 12-Lead -     MR ABDOMEN W CONTRAST; Future  Abnormal CT of the abdomen -     EKG 12-Lead -     MR ABDOMEN W CONTRAST; Future  Family history of  premature CAD   Follow up: pending cardiology consult, MRI abdomen

## 2022-08-05 ENCOUNTER — Ambulatory Visit: Payer: Medicaid Other | Attending: Cardiology | Admitting: Cardiology

## 2022-08-05 ENCOUNTER — Encounter: Payer: Self-pay | Admitting: Cardiology

## 2022-08-05 VITALS — BP 108/78 | HR 69 | Ht 62.0 in | Wt 143.0 lb

## 2022-08-05 DIAGNOSIS — R9389 Abnormal findings on diagnostic imaging of other specified body structures: Secondary | ICD-10-CM | POA: Diagnosis not present

## 2022-08-05 DIAGNOSIS — I517 Cardiomegaly: Secondary | ICD-10-CM

## 2022-08-05 NOTE — Patient Instructions (Addendum)
Medication Instructions:  Your physician recommends that you continue on your current medications as directed. Please refer to the Current Medication list given to you today.   *If you need a refill on your cardiac medications before your next appointment, please call your pharmacy*   Lab Work: NONE ordered at this time of appointment   If you have labs (blood work) drawn today and your tests are completely normal, you will receive your results only by: MyChart Message (if you have MyChart) OR A paper copy in the mail If you have any lab test that is abnormal or we need to change your treatment, we will call you to review the results.   Testing/Procedures: Your physician has requested that you have an echocardiogram.Your Echocardiography is a painless test that uses sound waves to create images of your heart. It provides your doctor with information about the size and shape of your heart and how well your heart's chambers and valves are working. This procedure takes approximately one hour. There are no restrictions for this procedure. Please do NOT wear cologne, perfume, aftershave, or lotions (deodorant is allowed). Please arrive 15 minutes prior to your appointment time.   Follow-Up: At [facility], you and your health needs are our priority.  As part of our continuing mission to provide you with exceptional heart care, we have created designated Provider Care Teams.  These Care Teams include your primary Cardiologist (physician) and Advanced Practice Providers (APPs -  Physician Assistants and Nurse Practitioners) who all work together to provide you with the care you need, when you need it.  We recommend signing up for the patient portal called "MyChart".  Sign up information is provided on this After Visit Summary.  MyChart is used to connect with patients for Virtual Visits (Telemedicine).  Patients are able to view lab/test results, encounter notes, upcoming appointments, etc.   Non-urgent messages can be sent to your provider as well.   To learn more about what you can do with MyChart, go to [email or web address].    Your next appointment:   As Needed   The format for your next appointment:   In Person  Provider:    Dr. Antoine Poche  Other Instructions   Important Information About Sugar

## 2022-08-06 ENCOUNTER — Other Ambulatory Visit: Payer: Self-pay | Admitting: Medical

## 2022-08-08 ENCOUNTER — Telehealth: Payer: Medicaid Other | Admitting: Nurse Practitioner

## 2022-08-08 DIAGNOSIS — N3000 Acute cystitis without hematuria: Secondary | ICD-10-CM

## 2022-08-08 MED ORDER — NITROFURANTOIN MONOHYD MACRO 100 MG PO CAPS: 100.0000 mg | ORAL_CAPSULE | Freq: Two times a day (BID) | ORAL | 0 refills | Status: AC

## 2022-08-08 NOTE — Progress Notes (Signed)
E-Visit for Urinary Problems  We are sorry that you are not feeling well.  Here is how we plan to help!  Based on what you shared with me it looks like you most likely have a simple urinary tract infection.  A UTI (Urinary Tract Infection) is a bacterial infection of the bladder.  Most cases of urinary tract infections are simple to treat but a key part of your care is to encourage you to drink plenty of fluids and watch your symptoms carefully.  I have prescribed MacroBid 100 mg twice a day for 5 days.  Your symptoms should gradually improve. Call us if the burning in your urine worsens, you develop worsening fever, back pain or pelvic pain or if your symptoms do not resolve after completing the antibiotic.  Urinary tract infections can be prevented by drinking plenty of water to keep your body hydrated.  Also be sure when you wipe, wipe from front to back and don't hold it in!  If possible, empty your bladder every 4 hours.  HOME CARE Drink plenty of fluids Compete the full course of the antibiotics even if the symptoms resolve Remember, when you need to go.go. Holding in your urine can increase the likelihood of getting a UTI! GET HELP RIGHT AWAY IF: You cannot urinate You get a high fever Worsening back pain occurs You see blood in your urine You feel sick to your stomach or throw up You feel like you are going to pass out  MAKE SURE YOU  Understand these instructions. Will watch your condition. Will get help right away if you are not doing well or get worse.   Thank you for choosing an e-visit.  Your e-visit answers were reviewed by a board certified advanced clinical practitioner to complete your personal care plan. Depending upon the condition, your plan could have included both over the counter or prescription medications.  Please review your pharmacy choice. Make sure the pharmacy is open so you can pick up prescription now. If there is a problem, you may contact your  provider through MyChart messaging and have the prescription routed to another pharmacy.  Your safety is important to us. If you have drug allergies check your prescription carefully.   For the next 24 hours you can use MyChart to ask questions about today's visit, request a non-urgent call back, or ask for a work or school excuse. You will get an email in the next two days asking about your experience. I hope that your e-visit has been valuable and will speed your recovery.   I spent approximately 5 minutes reviewing the patient's history, current symptoms and coordinating their plan of care today.   Meds ordered this encounter  Medications   nitrofurantoin, macrocrystal-monohydrate, (MACROBID) 100 MG capsule    Sig: Take 1 capsule (100 mg total) by mouth 2 (two) times daily for 5 days.    Dispense:  10 capsule    Refill:  0    

## 2022-08-19 HISTORY — PX: COLONOSCOPY: SHX174

## 2022-08-30 DIAGNOSIS — Z1211 Encounter for screening for malignant neoplasm of colon: Secondary | ICD-10-CM | POA: Diagnosis not present

## 2022-08-30 DIAGNOSIS — Q438 Other specified congenital malformations of intestine: Secondary | ICD-10-CM | POA: Diagnosis not present

## 2022-08-30 LAB — HM COLONOSCOPY

## 2022-08-31 ENCOUNTER — Encounter: Payer: Self-pay | Admitting: *Deleted

## 2022-08-31 ENCOUNTER — Ambulatory Visit (INDEPENDENT_AMBULATORY_CARE_PROVIDER_SITE_OTHER): Payer: Medicaid Other | Admitting: Medical

## 2022-08-31 ENCOUNTER — Encounter: Payer: Self-pay | Admitting: Medical

## 2022-08-31 VITALS — BP 116/74 | HR 68 | Ht 62.0 in | Wt 140.6 lb

## 2022-08-31 DIAGNOSIS — Z113 Encounter for screening for infections with a predominantly sexual mode of transmission: Secondary | ICD-10-CM | POA: Diagnosis not present

## 2022-08-31 DIAGNOSIS — Z0000 Encounter for general adult medical examination without abnormal findings: Secondary | ICD-10-CM

## 2022-08-31 DIAGNOSIS — Z13220 Encounter for screening for lipoid disorders: Secondary | ICD-10-CM

## 2022-08-31 DIAGNOSIS — J309 Allergic rhinitis, unspecified: Secondary | ICD-10-CM | POA: Diagnosis not present

## 2022-08-31 DIAGNOSIS — E559 Vitamin D deficiency, unspecified: Secondary | ICD-10-CM | POA: Diagnosis not present

## 2022-08-31 DIAGNOSIS — Z8249 Family history of ischemic heart disease and other diseases of the circulatory system: Secondary | ICD-10-CM | POA: Diagnosis not present

## 2022-08-31 DIAGNOSIS — K5909 Other constipation: Secondary | ICD-10-CM

## 2022-08-31 NOTE — Progress Notes (Signed)
Subjective: Chief Complaint  Patient presents with   Annual Exam    Fasting. Annual physical.    Medical team: Eye doctor Dentist Dr. Jeani Hawking, GI Japji Kok, Kermit Balo, PA-C here for primary care   Here for physical.  Doing fine in general.  I saw her recently for concerns, abdominal pain.  Eval was completed.  She actually had a colonoscopy this week with Dr. Elnoria Howard, normal colon, repeat 10 years.   No desired for contraception currently, partner has had a vasectomy.  Reviewed their medical, surgical, family, social, medication, and allergy history and updated chart as appropriate.  Past Medical History:  Diagnosis Date   Contraceptive management    Herpes genitalis in women    Seasonal allergic rhinitis    Sinusitis    history of, worse as teenager   Wears glasses     Past Surgical History:  Procedure Laterality Date   AUGMENTATION MAMMAPLASTY Bilateral 07/03/2017   BREAST ENHANCEMENT SURGERY  07/09/2016   [facility]   CESAREAN SECTION     twice   COSMETIC SURGERY  06/2016   tummy tuck, [facility]   HERNIA REPAIR  2014   umbilical    Social History   Socioeconomic History   Marital status: Single    Spouse name: Not on file   Number of children: 2   Years of education: Not on file   Highest education level: Not on file  Occupational History   Occupation: territory sales Barista)  Tobacco Use   Smoking status: Never   Smokeless tobacco: Never  Vaping Use   Vaping Use: Never used  Substance and Sexual Activity   Alcohol use: Yes    Comment: occasionally   Drug use: No   Sexual activity: Not Currently    Birth control/protection: Condom  Other Topics Concern   Not on file  Social History Narrative   Started [employer],  2019.  Exercise - walking.  Has 2 children.   Girls play travel volleyball.   08/2022   Social Determinants of Health   Financial Resource Strain: Not on file  Food Insecurity: Not on file   Transportation Needs: Not on file  Physical Activity: Not on file  Stress: Not on file  Social Connections: Not on file  Intimate Partner Violence: Not on file    Family History  Problem Relation Age of Onset   Hypertension Mother    Heart disease Mother 64       MI, CHF, CABG   Thyroid disease Mother    Cancer Mother 62       uterine cancer, treated, then liver with mets later   Other Mother        lumbar DDD   Arthritis Father    Diabetes Father    Varicose Veins Father    Anemia Sister    Depression Sister    Arthritis Paternal Grandmother    Heart disease Paternal Grandmother    Cancer Maternal Grandmother        pancreatic   Stroke Neg Hx      Current Outpatient Medications:    Cholecalciferol (VITAMIN D3) 50 MCG (2000 UT) capsule, TAKE 1 CAPSULE BY MOUTH EVERY DAY, Disp: 90 capsule, Rfl: 0   omeprazole (PRILOSEC) 40 MG capsule, TAKE 1 CAPSULE (40 MG TOTAL) BY MOUTH DAILY. (Patient not taking: Reported on 08/31/2022), Disp: 90 capsule, Rfl: 0   Plecanatide (TRULANCE) 3 MG TABS, Take 3 mg by mouth daily. (Patient not  taking: Reported on 08/31/2022), Disp: 30 tablet, Rfl: 1  Allergies  Allergen Reactions   Amoxicillin Hives   Cephalosporins Itching, Swelling and Hives   Mirena [Levonorgestrel]     Recurrent BV, bleeding   Penicillins Itching, Swelling and Other (See Comments)    Patient states that she can't move.    Review of Systems Constitutional: -fever, -chills, -sweats, -unexpected weight change, -decreased appetite, -fatigue Allergy: -sneezing, -itching, -congestion Dermatology: -changing moles, --rash  ENT: -runny nose, -ear pain, -sore throat, -hoarseness, -sinus pain, -teeth pain, - ringing in ears, -hearing loss, -nosebleeds Cardiology: -chest pain, -palpitations, -swelling, -difficulty breathing when lying flat, -waking up short of breath Respiratory: -cough, -shortness of breath, -difficulty breathing with exercise or exertion, -wheezing, -coughing  up blood Gastroenterology: -abdominal pain, -nausea, -vomiting, -diarrhea, -constipation, -blood in stool, -changes in bowel movement, -difficulty swallowing or eating Hematology: -bleeding, -bruising  Musculoskeletal: -joint aches, -muscle aches, -joint swelling, -back pain, -neck pain, -cramping, -changes in gait Ophthalmology: denies vision changes, eye redness, itching, discharge Urology: -burning with urination, -difficulty urinating, -blood in urine, -urinary frequency, -urgency, -incontinence Neurology: -headache, -weakness, -tingling, -numbness, -memory loss, -falls, -dizziness Psychology: -depressed mood, -agitation, -sleep problems     Objective:   Physical Exam  BP 116/74   Pulse 68   Ht 5\' 2"  (1.575 m)   Wt 140 lb 9.6 oz (63.8 kg)   LMP 08/22/2022   SpO2 97% Comment: room air  BMI 25.72 kg/m   General appearance: alert, no distress, WD/WN, lean African American female Skin: few scattered benign appearing macules, no worrisome lesions HEENT: normocephalic, conjunctiva/corneas normal, sclerae anicteric, PERRLA, EOMi, nares patent, no discharge or erythema, pharynx normal Oral cavity: MMM, tongue normal, teeth in good repair Neck: supple, no lymphadenopathy, no thyromegaly, no masses, normal ROM, no bruits Chest: non tender, normal shape and expansion Heart: RRR, normal S1, S2, no murmurs Lungs: CTA bilaterally, no wheezes, rhonchi, or rales Abdomen: +bs, soft, umbilical surgical scar, otherwise non tender, non distended, no masses, no hepatomegaly, no splenomegaly, no bruits Back: non tender, normal ROM, no scoliosis Musculoskeletal: upper extremities non tender, no obvious deformity, normal ROM throughout, lower extremities non tender, no obvious deformity, normal ROM throughout Extremities: no edema, no cyanosis, no clubbing Pulses: 2+ symmetric, upper and lower extremities, normal cap refill Neurological: alert, oriented x 3, CN2-12 intact, strength normal upper  extremities and lower extremities, sensation normal throughout, DTRs 2+ throughout, no cerebellar signs, gait normal Psychiatric: normal affect, behavior normal, pleasant  Breast: deferred Gyn: deferred Rectal: deferred    Assessment and Plan :    Encounter Diagnoses  Name Primary?   Encounter for health maintenance examination in adult Yes   Screen for STD (sexually transmitted disease)    Family history of premature coronary artery disease    Chronic constipation    Chronic allergic rhinitis    Vitamin D deficiency    Screening for lipid disorders     Physical exam - discussed healthy lifestyle, diet, exercise, preventative care, vaccinations, and addressed their concerns.   C/t mammogram q1-2 year, pap up to date, await colonoscopy report from this week  See your eye doctor yearly for routine vision care. See your dentist yearly for routine dental care including hygiene visits twice yearly.   Vaccines: Immunization History  Administered Date(s) Administered   Influenza,inj,Quad PF,6+ Mos 06/21/2013, 06/23/2014, 09/25/2015, 08/30/2016, 09/28/2017, 06/03/2021   PFIZER(Purple Top)SARS-COV-2 Vaccination 12/09/2019, 12/30/2019   Td 11/26/2019   Tdap 02/26/2009   Declines flu shot today  Specific issues: Vit D deficiency - labs today, continue supplement  Chronic constipation - ongoing diet measures  Abnormal CT scan abdomen recently, plan repeat in 6 months   [name] was seen today for annual exam.  Diagnoses and all orders for this visit:  Encounter for health maintenance examination in adult -     Comprehensive metabolic panel -     CBC -     Lipid panel -     VITAMIN D 25 Hydroxy (Vit-D Deficiency, Fractures) -     RPR+HIV+GC+CT Panel  Screen for STD (sexually transmitted disease) -     RPR+HIV+GC+CT Panel  Family history of premature coronary artery disease  Chronic constipation  Chronic allergic rhinitis  Vitamin D deficiency -     VITAMIN D 25  Hydroxy (Vit-D Deficiency, Fractures)  Screening for lipid disorders -     Lipid panel      F/u pending labs

## 2022-09-01 ENCOUNTER — Other Ambulatory Visit: Payer: Self-pay | Admitting: Medical

## 2022-09-01 MED ORDER — VITAMIN D3 50 MCG (2000 UT) PO CAPS: 2000.0000 [IU] | ORAL_CAPSULE | Freq: Every day | ORAL | 3 refills | Status: DC

## 2022-09-01 NOTE — Progress Notes (Signed)
Results sent through MyChart

## 2022-09-02 ENCOUNTER — Ambulatory Visit (HOSPITAL_COMMUNITY): Payer: Medicaid Other | Attending: Cardiology

## 2022-09-02 DIAGNOSIS — I358 Other nonrheumatic aortic valve disorders: Secondary | ICD-10-CM | POA: Diagnosis not present

## 2022-09-02 DIAGNOSIS — I517 Cardiomegaly: Secondary | ICD-10-CM | POA: Diagnosis not present

## 2022-09-02 LAB — COMPREHENSIVE METABOLIC PANEL
ALT: 8 IU/L (ref 0–32)
AST: 11 IU/L (ref 0–40)
Albumin/Globulin Ratio: 1.5 (ref 1.2–2.2)
Albumin: 4.1 g/dL (ref 3.9–4.9)
Alkaline Phosphatase: 74 IU/L (ref 44–121)
BUN/Creatinine Ratio: 11 (ref 9–23)
BUN: 8 mg/dL (ref 6–24)
Bilirubin Total: 0.3 mg/dL (ref 0.0–1.2)
CO2: 22 mmol/L (ref 20–29)
Calcium: 9.2 mg/dL (ref 8.7–10.2)
Chloride: 105 mmol/L (ref 96–106)
Creatinine, Ser: 0.7 mg/dL (ref 0.57–1.00)
Globulin, Total: 2.7 g/dL (ref 1.5–4.5)
Glucose: 83 mg/dL (ref 70–99)
Potassium: 4.2 mmol/L (ref 3.5–5.2)
Sodium: 140 mmol/L (ref 134–144)
Total Protein: 6.8 g/dL (ref 6.0–8.5)
eGFR: 109 mL/min/{1.73_m2} (ref >59)

## 2022-09-02 LAB — LIPID PANEL
Chol/HDL Ratio: 2.5 ratio (ref 0.0–4.4)
Cholesterol, Total: 187 mg/dL (ref 100–199)
HDL: 75 mg/dL (ref >39)
LDL Chol Calc (NIH): 103 mg/dL — ABNORMAL HIGH (ref 0–99)
Triglycerides: 46 mg/dL (ref 0–149)
VLDL Cholesterol Cal: 9 mg/dL (ref 5–40)

## 2022-09-02 LAB — ECHOCARDIOGRAM COMPLETE
Area-P 1/2: 3.72 cm2
S' Lateral: 2.8 cm

## 2022-09-02 LAB — CBC
Hematocrit: 35.8 % (ref 34.0–46.6)
Hemoglobin: 11.8 g/dL (ref 11.1–15.9)
MCH: 28.7 pg (ref 26.6–33.0)
MCHC: 33 g/dL (ref 31.5–35.7)
MCV: 87 fL (ref 79–97)
Platelets: 363 10*3/uL (ref 150–450)
RBC: 4.11 x10E6/uL (ref 3.77–5.28)
RDW: 13.7 % (ref 11.7–15.4)
WBC: 7.6 10*3/uL (ref 3.4–10.8)

## 2022-09-02 LAB — VITAMIN D 25 HYDROXY (VIT D DEFICIENCY, FRACTURES): Vit D, 25-Hydroxy: 27 ng/mL — ABNORMAL LOW (ref 30.0–100.0)

## 2022-09-02 LAB — RPR+HIV+GC+CT PANEL
Chlamydia trachomatis, NAA: NEGATIVE
HIV Screen 4th Generation wRfx: NONREACTIVE
Neisseria Gonorrhoeae by PCR: NEGATIVE
RPR Ser Ql: NONREACTIVE

## 2022-09-02 NOTE — Progress Notes (Signed)
Results sent through MyChart

## 2022-09-13 ENCOUNTER — Telehealth: Payer: Medicaid Other | Admitting: Family Medicine

## 2022-09-13 ENCOUNTER — Encounter: Payer: Self-pay | Admitting: Family Medicine

## 2022-09-13 DIAGNOSIS — J069 Acute upper respiratory infection, unspecified: Secondary | ICD-10-CM

## 2022-09-13 NOTE — Progress Notes (Signed)
   Subjective:    Patient ID: [name], female    DOB: Aug 04, 1976, 46 y.o.   MRN: [id number]  HPI Documentation for virtual audio and video telecommunications through [place] encounter: The patient was located at home. 2 patient identifiers used.  The provider was located in the office. The patient did consent to this visit and is aware of possible charges through their insurance for this visit. The other persons participating in this telemedicine service were none. Time spent on call was 5 minutes and in review of previous records >15 minutes total for counseling and coordination of care. This virtual service is not related to other E/M service within previous 7 days.  She was exposed to children over the weekend and had viral infections and 3 days ago she developed a slight cough, sore throat, sinus congestion, rhinorrhea and some postnasal drainage.  No fever, chills or earache.  She has been taking Zicam and states that it has helped.  She is also taking Advil Cold and Sinus.  Review of Systems     Objective:   Physical Exam Alert and in no distress with normal speech.       Assessment & Plan:  Viral URI with cough Recommend she continue with Zicam as well as Advil Cold and Sinus.  Suggested that over the next 3 to 4 days she should turn the corner and get better.  If she gets worse, she is to call.  She was comfortable with that.

## 2022-11-07 ENCOUNTER — Other Ambulatory Visit: Payer: Self-pay | Admitting: Medical

## 2022-11-07 DIAGNOSIS — Z1231 Encounter for screening mammogram for malignant neoplasm of breast: Secondary | ICD-10-CM

## 2022-11-09 ENCOUNTER — Telehealth: Payer: Medicaid Other | Admitting: Physician Assistant

## 2022-11-09 DIAGNOSIS — R3989 Other symptoms and signs involving the genitourinary system: Secondary | ICD-10-CM

## 2022-11-09 MED ORDER — NITROFURANTOIN MONOHYD MACRO 100 MG PO CAPS: 100.0000 mg | ORAL_CAPSULE | Freq: Two times a day (BID) | ORAL | 0 refills | Status: DC

## 2022-11-09 NOTE — Progress Notes (Signed)
E-Visit for Urinary Problems  We are sorry that you are not feeling well.  Here is how we plan to help!  Based on what you shared with me it looks like you most likely have a simple urinary tract infection.  A UTI (Urinary Tract Infection) is a bacterial infection of the bladder.  Most cases of urinary tract infections are simple to treat but a key part of your care is to encourage you to drink plenty of fluids and watch your symptoms carefully.  I have prescribed MacroBid 100 mg twice a day for 5 days.  Your symptoms should gradually improve. Call us if the burning in your urine worsens, you develop worsening fever, back pain or pelvic pain or if your symptoms do not resolve after completing the antibiotic.  Urinary tract infections can be prevented by drinking plenty of water to keep your body hydrated.  Also be sure when you wipe, wipe from front to back and don't hold it in!  If possible, empty your bladder every 4 hours.  HOME CARE Drink plenty of fluids Compete the full course of the antibiotics even if the symptoms resolve Remember, when you need to go.go. Holding in your urine can increase the likelihood of getting a UTI! GET HELP RIGHT AWAY IF: You cannot urinate You get a high fever Worsening back pain occurs You see blood in your urine You feel sick to your stomach or throw up You feel like you are going to pass out  MAKE SURE YOU  Understand these instructions. Will watch your condition. Will get help right away if you are not doing well or get worse.   Thank you for choosing an e-visit.  Your e-visit answers were reviewed by a board certified advanced clinical practitioner to complete your personal care plan. Depending upon the condition, your plan could have included both over the counter or prescription medications.  Please review your pharmacy choice. Make sure the pharmacy is open so you can pick up prescription now. If there is a problem, you may contact your  provider through [employer] and have the prescription routed to another pharmacy.  Your safety is important to [place]. If you have drug allergies check your prescription carefully.   For the next 24 hours you can use MyChart to ask questions about today's visit, request a non-urgent call back, or ask for a work or school excuse. You will get an email in the next two days asking about your experience. I hope that your e-visit has been valuable and will speed your recovery.  I have spent 5 minutes in review of e-visit questionnaire, review and updating patient chart, medical decision making and response to patient.   Mar Daring, PA-C

## 2022-12-23 ENCOUNTER — Ambulatory Visit
Admission: RE | Admit: 2022-12-23 | Discharge: 2022-12-23 | Disposition: A | Discharge status: Home / Self Care | Payer: Medicaid Other | Source: Ambulatory Visit | Attending: Medical | Admitting: Medical

## 2022-12-23 DIAGNOSIS — Z1231 Encounter for screening mammogram for malignant neoplasm of breast: Secondary | ICD-10-CM

## 2022-12-26 NOTE — Progress Notes (Signed)
Results sent through MyChart

## 2023-01-23 ENCOUNTER — Encounter: Payer: Self-pay | Admitting: Medical

## 2023-01-24 ENCOUNTER — Ambulatory Visit: Payer: Medicaid Other | Admitting: Medical

## 2023-01-24 ENCOUNTER — Encounter: Payer: Self-pay | Admitting: Medical

## 2023-01-24 NOTE — Progress Notes (Unsigned)
D def  Abdnoml ct abodmen    CT abdomen pelvis 07/28/2022  IMPRESSION: 1.  Possible constipation. 2. No other explanation for abdominal pain. 3. 2.7 cm lesion which is favored to arise from the left adrenal gland and represent a benign lesion, most likely a cyst, less likely an adenoma. Per consensus criteria, outpatient pre and post-contrast adrenal protocol CT is recommended. This could be performed in 6 months in order to confirm ongoing size stability. This recommendation follows ACR consensus guidelines: Management of Incidental Adrenal Masses: A White Paper of the ACR Incidental Findings Committee. J Am Coll Radiol 2017;14:1038-1044. 4. 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 A too small to characterize lateral segment left liver lobe is possibly also a hemangioma but can be re-evaluated on follow-up CT.

## 2023-01-26 ENCOUNTER — Encounter: Payer: Self-pay | Admitting: Medical

## 2023-01-27 ENCOUNTER — Other Ambulatory Visit: Payer: Self-pay | Admitting: Medical

## 2023-01-27 DIAGNOSIS — E279 Disorder of adrenal gland, unspecified: Secondary | ICD-10-CM

## 2023-01-27 DIAGNOSIS — R935 Abnormal findings on diagnostic imaging of other abdominal regions, including retroperitoneum: Secondary | ICD-10-CM

## 2023-01-31 ENCOUNTER — Other Ambulatory Visit: Payer: Self-pay | Admitting: Medical

## 2023-01-31 ENCOUNTER — Other Ambulatory Visit: Payer: Medicaid Other

## 2023-01-31 DIAGNOSIS — E279 Disorder of adrenal gland, unspecified: Secondary | ICD-10-CM

## 2023-01-31 DIAGNOSIS — R935 Abnormal findings on diagnostic imaging of other abdominal regions, including retroperitoneum: Secondary | ICD-10-CM

## 2023-02-06 ENCOUNTER — Encounter: Payer: Self-pay | Admitting: Medical

## 2023-02-07 ENCOUNTER — Ambulatory Visit: Payer: Medicaid Other | Admitting: Medical

## 2023-02-15 ENCOUNTER — Other Ambulatory Visit: Payer: Self-pay | Admitting: Medical

## 2023-02-23 ENCOUNTER — Ambulatory Visit
Admission: RE | Admit: 2023-02-23 | Discharge: 2023-02-23 | Disposition: A | Discharge status: Home / Self Care | Payer: Medicaid Other | Source: Ambulatory Visit | Attending: Medical | Admitting: Medical

## 2023-02-23 DIAGNOSIS — N83292 Other ovarian cyst, left side: Secondary | ICD-10-CM | POA: Diagnosis not present

## 2023-02-23 DIAGNOSIS — R935 Abnormal findings on diagnostic imaging of other abdominal regions, including retroperitoneum: Secondary | ICD-10-CM

## 2023-02-23 DIAGNOSIS — E279 Disorder of adrenal gland, unspecified: Secondary | ICD-10-CM

## 2023-02-23 MED ORDER — GADOPICLENOL 0.5 MMOL/ML IV SOLN
6.0000 mL | Freq: Once | INTRAVENOUS | Status: AC | PRN
  Administered 2023-02-23: 6 mL via INTRAVENOUS

## 2023-02-28 NOTE — Progress Notes (Signed)
Results sent through MyChart

## 2023-04-12 ENCOUNTER — Telehealth: Payer: Medicaid Other

## 2023-04-12 ENCOUNTER — Telehealth: Payer: Medicaid Other | Admitting: Family Medicine

## 2023-04-12 DIAGNOSIS — J069 Acute upper respiratory infection, unspecified: Secondary | ICD-10-CM | POA: Diagnosis not present

## 2023-04-12 DIAGNOSIS — Z91199 Patient's noncompliance with other medical treatment and regimen due to unspecified reason: Secondary | ICD-10-CM

## 2023-04-12 MED ORDER — FLUTICASONE PROPIONATE 50 MCG/ACT NA SUSP
2.0000 | Freq: Every day | NASAL | 0 refills | Status: DC
Start: 2023-04-12 — End: 2023-09-19

## 2023-04-12 MED ORDER — BENZONATATE 100 MG PO CAPS
100.0000 mg | ORAL_CAPSULE | Freq: Two times a day (BID) | ORAL | 0 refills | Status: DC | PRN
Start: 2023-04-12 — End: 2023-09-05

## 2023-04-12 NOTE — Progress Notes (Signed)
The patient arrived early, but did not return- no-showed for appointment despite this provider sending direct link, reaching out via phone with no response and waiting for at least 10 minutes from appointment time for patient to join. They will be marked as a NS for this appointment/time.   Freddy Finner, NP

## 2023-04-12 NOTE — Progress Notes (Signed)

## 2023-07-24 DIAGNOSIS — M5386 Other specified dorsopathies, lumbar region: Secondary | ICD-10-CM | POA: Diagnosis not present

## 2023-07-24 DIAGNOSIS — M9904 Segmental and somatic dysfunction of sacral region: Secondary | ICD-10-CM | POA: Diagnosis not present

## 2023-07-24 DIAGNOSIS — M9903 Segmental and somatic dysfunction of lumbar region: Secondary | ICD-10-CM | POA: Diagnosis not present

## 2023-07-24 DIAGNOSIS — M9905 Segmental and somatic dysfunction of pelvic region: Secondary | ICD-10-CM | POA: Diagnosis not present

## 2023-07-30 ENCOUNTER — Telehealth: Payer: Self-pay | Admitting: Medical

## 2023-07-30 NOTE — Telephone Encounter (Signed)
P.A. TRULANCE RENEWAL  

## 2023-07-31 DIAGNOSIS — M9903 Segmental and somatic dysfunction of lumbar region: Secondary | ICD-10-CM | POA: Diagnosis not present

## 2023-07-31 DIAGNOSIS — M9904 Segmental and somatic dysfunction of sacral region: Secondary | ICD-10-CM | POA: Diagnosis not present

## 2023-07-31 DIAGNOSIS — M9905 Segmental and somatic dysfunction of pelvic region: Secondary | ICD-10-CM | POA: Diagnosis not present

## 2023-07-31 DIAGNOSIS — M5386 Other specified dorsopathies, lumbar region: Secondary | ICD-10-CM | POA: Diagnosis not present

## 2023-08-11 NOTE — Telephone Encounter (Signed)
Key: [facility]) PA Case ID #: [id number] Need Help? Call us at [phone] Outcome Approved on November 10 by [facility] [place] Case: [id number], Status: Approved, Coverage Starts on: 07/30/2023 12:00:00 AM, Coverage Ends on: 07/29/2024 12:00:00 AM. Authorization Expiration Date: 07/28/2024 Drug [employer]ulance 3MG  tablets ePA cloud logo Form [employer]  Sent Unity message

## 2023-09-05 ENCOUNTER — Ambulatory Visit: Payer: Medicaid Other | Admitting: Medical

## 2023-09-05 ENCOUNTER — Other Ambulatory Visit: Payer: Self-pay | Admitting: Medical

## 2023-09-05 ENCOUNTER — Telehealth: Payer: Self-pay

## 2023-09-05 ENCOUNTER — Encounter: Payer: Self-pay | Admitting: Medical

## 2023-09-05 ENCOUNTER — Other Ambulatory Visit (HOSPITAL_COMMUNITY): Payer: Self-pay

## 2023-09-05 VITALS — BP 120/80 | HR 94 | Ht 62.0 in | Wt 144.6 lb

## 2023-09-05 DIAGNOSIS — K5909 Other constipation: Secondary | ICD-10-CM | POA: Diagnosis not present

## 2023-09-05 DIAGNOSIS — L729 Follicular cyst of the skin and subcutaneous tissue, unspecified: Secondary | ICD-10-CM | POA: Diagnosis not present

## 2023-09-05 DIAGNOSIS — Z13220 Encounter for screening for lipoid disorders: Secondary | ICD-10-CM | POA: Insufficient documentation

## 2023-09-05 DIAGNOSIS — E559 Vitamin D deficiency, unspecified: Secondary | ICD-10-CM

## 2023-09-05 DIAGNOSIS — Z0000 Encounter for general adult medical examination without abnormal findings: Secondary | ICD-10-CM

## 2023-09-05 DIAGNOSIS — Z8349 Family history of other endocrine, nutritional and metabolic diseases: Secondary | ICD-10-CM

## 2023-09-05 DIAGNOSIS — J309 Allergic rhinitis, unspecified: Secondary | ICD-10-CM | POA: Diagnosis not present

## 2023-09-05 LAB — LIPID PANEL

## 2023-09-05 MED ORDER — CLINDAMYCIN PHOS-BENZOYL PEROX 1-5 % EX GEL: Freq: Two times a day (BID) | CUTANEOUS | 0 refills | Status: DC

## 2023-09-05 MED ORDER — CLINDAMYCIN PHOS-BENZOYL PEROX 1.2-5 % EX GEL: 1.0000 | Freq: Two times a day (BID) | CUTANEOUS | 0 refills | Status: DC

## 2023-09-05 NOTE — Progress Notes (Signed)
Subjective: Chief Complaint  Patient presents with   Annual Exam    Fating cpe,  would like to have her lymph nodes under arms, declines pelvic and pap today. Declines vaccines today   Medical team: Eye doctor Dentist Dr. Jeani Hawking, GI Brock Larmon, Kermit Balo, PA-C here for primary care  Here with husband today   Concerns: Using natural deodorant.  Using baking soda in underarms but has some "lymph nodes in armpits that are aggravated.  She notes she has some knots in the armpits that have just stayed somewhat inflamed.   No drainage.  Has had some redness recently.     Using D3/K2 vitamin supplement  Using Flonase prn, not daily  She notes some sinus problems.   Awakes in middle of night having to blow nose at times.   Then has runny nose a lot.   No sore throat, no ear pressure.  Stays that way year round.   Has new appt with allergist end of this month.   Has 2 cats at home.  Reviewed their medical, surgical, family, social, medication, and allergy history and updated chart as appropriate.  Past Medical History:  Diagnosis Date   Contraceptive management    Herpes genitalis in women    Seasonal allergic rhinitis    Sinusitis    history of, worse as teenager   Wears glasses     Past Surgical History:  Procedure Laterality Date   AUGMENTATION MAMMAPLASTY Bilateral 07/03/2017   BREAST ENHANCEMENT SURGERY  07/09/2016   [facility] medical school   CESAREAN SECTION     twice   COLONOSCOPY  08/2022   normal, repeat 10 years   COSMETIC SURGERY  06/2016   tummy tuck, [facility]   HERNIA REPAIR  2014   umbilical    Social History   Socioeconomic History   Marital status: Single    Spouse name: Not on file   Number of children: 2   Years of education: Not on file   Highest education level: Not on file  Occupational History   Occupation: territory sales Barista)  Tobacco Use   Smoking status: Never   Smokeless tobacco: Never  Vaping Use    Vaping status: Never Used  Substance and Sexual Activity   Alcohol use: Yes    Comment: occasionally   Drug use: No   Sexual activity: Not Currently    Birth control/protection: Condom  Other Topics Concern   Not on file  Social History Narrative   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  Exercise -none of late, but her and family just started back to [facility], [facility], [facility].  Married, has 2 children.   08/2023   Social Drivers of Health   Financial Resource Strain: Low Risk  (09/05/2023)   Overall Financial Resource Strain (CARDIA)    Difficulty of Paying Living Expenses: Not very hard  Food Insecurity: No Food Insecurity (09/05/2023)   Hunger Vital Sign    Worried About Running Out of Food in the Last Year: Never true    Ran Out of Food in the Last Year: Never true  Transportation Needs: No Transportation Needs (09/05/2023)   PRAPARE - Administrator, Civil Service (Medical): No    Lack of Transportation (Non-Medical): No  Physical Activity: Insufficiently Active (09/05/2023)   Exercise Vital Sign    Days of Exercise per Week: 3 days    Minutes of Exercise per Session: 30 min  Stress: No Stress Concern Present (09/05/2023)  [employer] of Occupational Health - Occupational Stress Questionnaire    Feeling of Stress : Only a little  Social Connections: Moderately Isolated (09/05/2023)   Social Connection and Isolation Panel [NHANES]    Frequency of Communication with Friends and Family: Three times a week    Frequency of Social Gatherings with Friends and Family: Once a week    Attends Religious Services: Never    Database administrator or Organizations: No    Attends Banker Meetings: Never    Marital Status: Married  Catering manager Violence: Not At Risk (09/05/2023)   Humiliation, Afraid, Rape, and Kick questionnaire    Fear of Current or Ex-Partner: No    Emotionally Abused: No    Physically Abused: No    Sexually Abused: No    Family History   Problem Relation Age of Onset   Hypertension Mother    Heart disease Mother 78       MI, CHF, CABG   Thyroid disease Mother    Cancer Mother 59       uterine cancer, treated, then liver with mets later   Other Mother        lumbar DDD   Arthritis Father    Diabetes Father    Varicose Veins Father    Anemia Sister    Depression Sister    Arthritis Paternal Grandmother    Heart disease Paternal Grandmother    Cancer Maternal Grandmother        pancreatic   Stroke Neg Hx      Current Outpatient Medications:    Cholecalciferol (VITAMIN D3) 50 MCG (2000 UT) capsule, Take 1 capsule (2,000 Units total) by mouth daily., Disp: 90 capsule, Rfl: 3   clindamycin-benzoyl peroxide (BENZACLIN) gel, Apply topically 2 (two) times daily., Disp: 25 g, Rfl: 0   fluticasone (FLONASE) 50 MCG/ACT nasal spray, Place 2 sprays into both nostrils daily. (Patient not taking: Reported on 09/05/2023), Disp: 16 g, Rfl: 0  Allergies  Allergen Reactions   Amoxicillin Hives   Cephalosporins Itching, Swelling and Hives   Mirena [Levonorgestrel]     Recurrent BV, bleeding   Penicillins Itching, Swelling and Other (See Comments)    Patient states that she can't move.    Review of Systems  Constitutional:  Negative for chills, fever, malaise/fatigue and weight loss.  HENT:  Positive for congestion. Negative for ear pain, hearing loss, sore throat and tinnitus.   Eyes:  Negative for blurred vision, pain and redness.  Respiratory:  Negative for cough, hemoptysis and shortness of breath.   Cardiovascular:  Negative for chest pain, palpitations, orthopnea, claudication and leg swelling.  Gastrointestinal:  Negative for abdominal pain, blood in stool, constipation, diarrhea, nausea and vomiting.  Genitourinary:  Negative for dysuria, flank pain, frequency, hematuria and urgency.  Musculoskeletal:  Negative for falls, joint pain and myalgias.  Skin:  Positive for rash. Negative for itching.  Neurological:   Negative for dizziness, tingling, speech change, weakness and headaches.  Endo/Heme/Allergies:  Negative for polydipsia. Does not bruise/bleed easily.  Psychiatric/Behavioral:  Negative for depression and memory loss. The patient is not nervous/anxious and does not have insomnia.         Objective:   Physical Exam  BP 120/80   Pulse 94   Ht 5\' 2"  (1.575 m)   Wt 144 lb 9.6 oz (65.6 kg)   LMP 08/29/2023   BMI 26.45 kg/m   Wt Readings from Last 3 Encounters:  09/05/23 144 lb 9.6 oz (  65.6 kg)  01/24/23 141 lb 12.8 oz (64.3 kg)  08/31/22 140 lb 9.6 oz (63.8 kg)    General appearance: alert, no distress, WD/WN, lean African American female Skin: few superficial sub centimeter nontender nodules in bilat axilla, no fluctuance, no duration or warmth, no drainage, and some mild pink/red irritated skin of both axilla, no other worrisome lesions HEENT: normocephalic, conjunctiva/corneas normal, sclerae anicteric, PERRLA, EOMi, nares with turbinate edema, but no discharge or erythema, pharynx normal Oral cavity: MMM, tongue normal, teeth in good repair Neck: supple, no lymphadenopathy, no thyromegaly, no masses, normal ROM, no bruits Chest: non tender, normal shape and expansion Heart: RRR, normal S1, S2, no murmurs Lungs: CTA bilaterally, no wheezes, rhonchi, or rales Abdomen: +bs, soft, umbilical surgical scar, otherwise non tender, non distended, no masses, no hepatomegaly, no splenomegaly, no bruits Back: non tender, normal ROM, no scoliosis Musculoskeletal: upper extremities non tender, no obvious deformity, normal ROM throughout, lower extremities non tender, no obvious deformity, normal ROM throughout Extremities: no edema, no cyanosis, no clubbing Pulses: 2+ symmetric, upper and lower extremities, normal cap refill Neurological: alert, oriented x 3, CN2-12 intact, strength normal upper extremities and lower extremities, sensation normal throughout, DTRs 2+ throughout, no cerebellar  signs, gait normal Psychiatric: normal affect, behavior normal, pleasant  Breast: deferred Gyn: deferred Rectal: deferred    Assessment and Plan :    Encounter Diagnoses  Name Primary?   Encounter for health maintenance examination in adult Yes   Family history of thyroid disease    Chronic allergic rhinitis    Chronic constipation    Vitamin D deficiency    Screening for lipid disorders    Cyst of skin      This visit was a preventative care visit, also known as wellness visit or routine physical.   Topics typically include healthy lifestyle, diet, exercise, preventative care, vaccinations, sick and well care, proper use of emergency dept and after hours care, as well as other concerns.     Recommendations: Continue to return yearly for your annual wellness and preventative care visits.  This gives [place] a chance to discuss healthy lifestyle, exercise, vaccinations, review your chart record, and perform screenings where appropriate.  I recommend you see your eye doctor yearly for routine vision care.  I recommend you see your dentist yearly for routine dental care including hygiene visits twice yearly.   Vaccination recommendations were reviewed Immunization History  Administered Date(s) Administered   Influenza,inj,Quad PF,6+ Mos 06/21/2013, 06/23/2014, 09/25/2015, 08/30/2016, 09/28/2017, 06/03/2021   PFIZER(Purple Top)SARS-COV-2 Vaccination 12/09/2019, 12/30/2019   Td 11/26/2019   Tdap 02/26/2009    Declines flu shot   Screening for cancer: Colon cancer screening: I reviewed your colonoscopy on file that is up to date from 08/2022.  Repeat 10 years You should do stool cards /FIT testing every 3-5 years  Breast cancer screening: You should perform a self breast exam monthly.   Mammogram reviewed from 12/2022, normal  Cervical cancer screening: We reviewed recommendations for pap smear screening.  11/2019 pap negative for abnormal cells and negative HPV Repeat  every 3-5 years  Skin cancer screening: Check your skin regularly for new changes, growing lesions, or other lesions of concern Come in for evaluation if you have skin lesions of concern.  Lung cancer screening: If you have a greater than 20 pack year history of tobacco use, then you may qualify for lung cancer screening with a chest CT scan.   Please call your insurance company to inquire about  coverage for this test.  We currently don't have screenings for other cancers besides breast, cervical, colon, and lung cancers.  If you have a strong family history of cancer or have other cancer screening concerns, please let me know.    Bone health: Get at least 150 minutes of aerobic exercise weekly Get weight bearing exercise at least once weekly Bone density test:  A bone density test is an imaging test that uses a type of X-ray to measure the amount of calcium and other minerals in your bones. The test may be used to diagnose or screen you for a condition that causes weak or thin bones (osteoporosis), predict your risk for a broken bone (fracture), or determine how well your osteoporosis treatment is working. The bone density test is recommended for females 65 and older, or females or males <65 if certain risk factors such as thyroid disease, long term use of steroids such as for asthma or rheumatological issues, vitamin D deficiency, estrogen deficiency, family history of osteoporosis, self or family history of fragility fracture in first degree relative.    Heart health: Get at least 150 minutes of aerobic exercise weekly Limit alcohol It is important to maintain a healthy blood pressure and healthy cholesterol numbers  Heart disease screening: Screening for heart disease includes screening for blood pressure, fasting lipids, glucose/diabetes screening, BMI height to weight ratio, reviewed of smoking status, physical activity, and diet.    Goals include blood pressure 120/80 or less,  maintaining a healthy lipid/cholesterol profile, preventing diabetes or keeping diabetes numbers under good control, not smoking or using tobacco products, exercising most days per week or at least 150 minutes per week of exercise, and eating healthy variety of fruits and vegetables, healthy oils, and avoiding unhealthy food choices like fried food, fast food, high sugar and high cholesterol foods.    Other tests may possibly include EKG test, CT coronary calcium score, echocardiogram, exercise treadmill stress test.     Medical care options: I recommend you continue to seek care here first for routine care.  We try really hard to have available appointments Monday through Friday daytime hours for sick visits, acute visits, and physicals.  Urgent care should be used for after hours and weekends for significant issues that cannot wait till the next day.  The emergency department should be used for significant potentially life-threatening emergencies.  The emergency department is expensive, can often have long wait times for less significant concerns, so try to utilize primary care, urgent care, or telemedicine when possible to avoid unnecessary trips to the emergency department.  Virtual visits and telemedicine have been introduced since the pandemic started in 2020, and can be convenient ways to receive medical care.  We offer virtual appointments as well to assist you in a variety of options to seek medical care.   Advanced Directives: I recommend you consider completing a Health Care Power of Attorney and Living Will.   These documents respect your wishes and help alleviate burdens on your loved ones if you were to become terminally ill or be in a position to need those documents enforced.    You can complete Advanced Directives yourself, have them notarized, then have copies made for our office, for you and for anybody you feel should have them in safe keeping.  Or, you can have an attorney prepare  these documents.   If you haven't updated your Last Will and Testament in a while, it may be worthwhile having an attorney prepare  these documents together and save on some costs.      Other significant issues: Cyst of skin in axilla/possible hidradenitis Begin trial of benzaclin for the next several weeks F/u with dermatology, dermatology referral placed given this chronic issue  Chronic allergic rhinitis  Discussed options for therapy Consider using Flonase daily instead of prn She declines oral ant histamine currently She has new patient appt with allergist coming up later this month   Vitamin D deficiency  On supplement, updated lab today   Esha was seen today for annual exam.  Diagnoses and all orders for this visit:  Encounter for health maintenance examination in adult -     Comprehensive metabolic panel -     CBC -     Lipid panel -     VITAMIN D 25 Hydroxy (Vit-D Deficiency, Fractures) -     TSH  Family history of thyroid disease -     TSH  Chronic allergic rhinitis  Chronic constipation  Vitamin D deficiency -     VITAMIN D 25 Hydroxy (Vit-D Deficiency, Fractures)  Screening for lipid disorders -     Lipid panel  Cyst of skin -     Ambulatory referral to Dermatology  Other orders -     clindamycin-benzoyl peroxide (BENZACLIN) gel; Apply topically 2 (two) times daily.    F/u pending labs

## 2023-09-05 NOTE — Patient Instructions (Signed)
This visit was a preventative care visit, also known as wellness visit or routine physical.   Topics typically include healthy lifestyle, diet, exercise, preventative care, vaccinations, sick and well care, proper use of emergency dept and after hours care, as well as other concerns.     Recommendations: Continue to return yearly for your annual wellness and preventative care visits.  This gives [place] a chance to discuss healthy lifestyle, exercise, vaccinations, review your chart record, and perform screenings where appropriate.  I recommend you see your eye doctor yearly for routine vision care.  I recommend you see your dentist yearly for routine dental care including hygiene visits twice yearly.   Vaccination recommendations were reviewed Immunization History  Administered Date(s) Administered   Influenza,inj,Quad PF,6+ Mos 06/21/2013, 06/23/2014, 09/25/2015, 08/30/2016, 09/28/2017, 06/03/2021   PFIZER(Purple Top)SARS-COV-2 Vaccination 12/09/2019, 12/30/2019   Td 11/26/2019   Tdap 02/26/2009    Declines flu shot   Screening for cancer: Colon cancer screening: I reviewed your colonoscopy on file that is up to date from 08/2022.  Repeat 10 years You should do stool cards /FIT testing every 3-5 years  Breast cancer screening: You should perform a self breast exam monthly.   Mammogram reviewed from 12/2022, normal  Cervical cancer screening: We reviewed recommendations for pap smear screening.  11/2019 pap negative for abnormal cells and negative HPV Repeat every 3-5 years  Skin cancer screening: Check your skin regularly for new changes, growing lesions, or other lesions of concern Come in for evaluation if you have skin lesions of concern.  Lung cancer screening: If you have a greater than 20 pack year history of tobacco use, then you may qualify for lung cancer screening with a chest CT scan.   Please call your insurance company to inquire about coverage for this test.  We  currently don't have screenings for other cancers besides breast, cervical, colon, and lung cancers.  If you have a strong family history of cancer or have other cancer screening concerns, please let me know.    Bone health: Get at least 150 minutes of aerobic exercise weekly Get weight bearing exercise at least once weekly Bone density test:  A bone density test is an imaging test that uses a type of X-ray to measure the amount of calcium and other minerals in your bones. The test may be used to diagnose or screen you for a condition that causes weak or thin bones (osteoporosis), predict your risk for a broken bone (fracture), or determine how well your osteoporosis treatment is working. The bone density test is recommended for females 65 and older, or females or males <65 if certain risk factors such as thyroid disease, long term use of steroids such as for asthma or rheumatological issues, vitamin D deficiency, estrogen deficiency, family history of osteoporosis, self or family history of fragility fracture in first degree relative.    Heart health: Get at least 150 minutes of aerobic exercise weekly Limit alcohol It is important to maintain a healthy blood pressure and healthy cholesterol numbers  Heart disease screening: Screening for heart disease includes screening for blood pressure, fasting lipids, glucose/diabetes screening, BMI height to weight ratio, reviewed of smoking status, physical activity, and diet.    Goals include blood pressure 120/80 or less, maintaining a healthy lipid/cholesterol profile, preventing diabetes or keeping diabetes numbers under good control, not smoking or using tobacco products, exercising most days per week or at least 150 minutes per week of exercise, and eating healthy variety of fruits  and vegetables, healthy oils, and avoiding unhealthy food choices like fried food, fast food, high sugar and high cholesterol foods.    Other tests may possibly  include EKG test, CT coronary calcium score, echocardiogram, exercise treadmill stress test.     Medical care options: I recommend you continue to seek care here first for routine care.  We try really hard to have available appointments Monday through Friday daytime hours for sick visits, acute visits, and physicals.  Urgent care should be used for after hours and weekends for significant issues that cannot wait till the next day.  The emergency department should be used for significant potentially life-threatening emergencies.  The emergency department is expensive, can often have long wait times for less significant concerns, so try to utilize primary care, urgent care, or telemedicine when possible to avoid unnecessary trips to the emergency department.  Virtual visits and telemedicine have been introduced since the pandemic started in 2020, and can be convenient ways to receive medical care.  We offer virtual appointments as well to assist you in a variety of options to seek medical care.   Advanced Directives: I recommend you consider completing a Health Care Power of Attorney and Living Will.   These documents respect your wishes and help alleviate burdens on your loved ones if you were to become terminally ill or be in a position to need those documents enforced.    You can complete Advanced Directives yourself, have them notarized, then have copies made for our office, for you and for anybody you feel should have them in safe keeping.  Or, you can have an attorney prepare these documents.   If you haven't updated your Last Will and Testament in a while, it may be worthwhile having an attorney prepare these documents together and save on some costs.      Other significant issues: Cyst of skin in axilla/possible hidradenitis Begin trial of benzaclin for the next several weeks F/u with dermatology, dermatology referral placed given this chronic issue  Chronic allergic rhinitis  Discussed  options for therapy Consider using Flonase daily instead of prn She declines oral ant histamine currently She has new patient appt with allergist coming up later this month   Vitamin D deficiency  On supplement, updated lab today

## 2023-09-05 NOTE — Telephone Encounter (Signed)
Hello,             I have received a Prior Authorization Request for ''Clindamycin Phos-Benzoyl Perox 1-5% gel''. However while doing the Prior I was asked the following:    Please note that the patient has Not tried and failed One or two of the preferred medications on ''[facility]. However The Generic is covered. Which is ''clindamycin-benzoyl peroxide gel (generic for Duac''. Which is similar but has different strength. If you would like to prescribe this its 4.00 and No Prior Authorization would be required. Please let me know what you suggest.  Thanks

## 2023-09-05 NOTE — Telephone Encounter (Signed)
Pharmacy Patient Advocate Encounter  Insurance verification completed.    The patient is insured through [facility]   Ran test claim for clindamycin-benzoyl peroxide gel (generic for Duac ). Currently a quantity of 45G is a 30 day supply and the co-pay is 4.00 .No P/A REQ         This test claim was processed through [facility]- copay amounts may vary at other pharmacies due to pharmacy/plan contracts, or as the patient moves through the different stages of their insurance plan.

## 2023-09-06 LAB — COMPREHENSIVE METABOLIC PANEL
ALT: 10 IU/L (ref 0–32)
AST: 16 IU/L (ref 0–40)
Albumin: 4.4 g/dL (ref 3.9–4.9)
Alkaline Phosphatase: 75 IU/L (ref 44–121)
BUN/Creatinine Ratio: 16 (ref 9–23)
BUN: 11 mg/dL (ref 6–24)
Bilirubin Total: 0.5 mg/dL (ref 0.0–1.2)
CO2: 19 mmol/L — ABNORMAL LOW (ref 20–29)
Calcium: 9.1 mg/dL (ref 8.7–10.2)
Chloride: 106 mmol/L (ref 96–106)
Creatinine, Ser: 0.67 mg/dL (ref 0.57–1.00)
Globulin, Total: 2.6 g/dL (ref 1.5–4.5)
Glucose: 89 mg/dL (ref 70–99)
Potassium: 4.4 mmol/L (ref 3.5–5.2)
Sodium: 141 mmol/L (ref 134–144)
Total Protein: 7 g/dL (ref 6.0–8.5)
eGFR: 109 mL/min/{1.73_m2} (ref >59)

## 2023-09-06 LAB — CBC
Hematocrit: 37.1 % (ref 34.0–46.6)
Hemoglobin: 12.3 g/dL (ref 11.1–15.9)
MCH: 29.1 pg (ref 26.6–33.0)
MCHC: 33.2 g/dL (ref 31.5–35.7)
MCV: 88 fL (ref 79–97)
Platelets: 341 10*3/uL (ref 150–450)
RBC: 4.22 x10E6/uL (ref 3.77–5.28)
RDW: 13.9 % (ref 11.7–15.4)
WBC: 7.5 10*3/uL (ref 3.4–10.8)

## 2023-09-06 LAB — LIPID PANEL
Cholesterol, Total: 207 mg/dL — ABNORMAL HIGH (ref 100–199)
HDL: 83 mg/dL (ref >39)
LDL CALC COMMENT:: 2.5 ratio (ref 0.0–4.4)
LDL Chol Calc (NIH): 115 mg/dL — ABNORMAL HIGH (ref 0–99)
Triglycerides: 52 mg/dL (ref 0–149)
VLDL Cholesterol Cal: 9 mg/dL (ref 5–40)

## 2023-09-06 LAB — VITAMIN D 25 HYDROXY (VIT D DEFICIENCY, FRACTURES): Vit D, 25-Hydroxy: 28.6 ng/mL — ABNORMAL LOW (ref 30.0–100.0)

## 2023-09-06 LAB — TSH: TSH: 1.13 u[IU]/mL (ref 0.450–4.500)

## 2023-09-06 NOTE — Telephone Encounter (Signed)
Pt was notified.  

## 2023-09-07 ENCOUNTER — Other Ambulatory Visit: Payer: Self-pay | Admitting: Medical

## 2023-09-07 MED ORDER — VITAMIN D (ERGOCALCIFEROL) 1.25 MG (50000 UNIT) PO CAPS: 50000.0000 [IU] | ORAL_CAPSULE | ORAL | 3 refills | Status: AC

## 2023-09-07 NOTE — Progress Notes (Signed)
Results sent through MyChart

## 2023-09-19 ENCOUNTER — Other Ambulatory Visit: Payer: Self-pay

## 2023-09-19 ENCOUNTER — Ambulatory Visit (INDEPENDENT_AMBULATORY_CARE_PROVIDER_SITE_OTHER): Payer: Medicaid Other | Admitting: Allergy & Immunology

## 2023-09-19 ENCOUNTER — Encounter: Payer: Self-pay | Admitting: Allergy & Immunology

## 2023-09-19 VITALS — BP 122/86 | HR 66 | Temp 98.0°F | Resp 16 | Ht 62.21 in | Wt 147.2 lb

## 2023-09-19 DIAGNOSIS — J3089 Other allergic rhinitis: Secondary | ICD-10-CM | POA: Diagnosis not present

## 2023-09-19 DIAGNOSIS — J302 Other seasonal allergic rhinitis: Secondary | ICD-10-CM | POA: Diagnosis not present

## 2023-09-19 DIAGNOSIS — T781XXD Other adverse food reactions, not elsewhere classified, subsequent encounter: Secondary | ICD-10-CM | POA: Diagnosis not present

## 2023-09-19 MED ORDER — FLUTICASONE PROPIONATE 50 MCG/ACT NA SUSP: 2.0000 | Freq: Every day | NASAL | 5 refills | Status: AC

## 2023-09-19 MED ORDER — LEVOCETIRIZINE DIHYDROCHLORIDE 5 MG PO TABS: 5.0000 mg | ORAL_TABLET | Freq: Every evening | ORAL | 5 refills | Status: AC

## 2023-09-19 MED ORDER — EPINEPHRINE 0.3 MG/0.3ML IJ SOAJ: 0.3000 mg | Freq: Once | INTRAMUSCULAR | 2 refills | Status: AC

## 2023-09-19 NOTE — Patient Instructions (Addendum)
 1. Chronic rhinitis - Testing today showed: grasses, ragweed, weeds, indoor molds, dust mites, cat, and dog - Copy of test results provided.  - Avoidance measures provided. - Start taking: Xyzal  (levocetirizine) 5mg  tablet once daily and Flonase  (fluticasone ) one spray per nostril daily (AIM FOR EAR ON EACH SIDE) - You can use an extra dose of the antihistamine, if needed, for breakthrough symptoms.  - Consider nasal saline rinses 1-2 times daily to remove allergens from the nasal cavities as well as help with mucous clearance (this is especially helpful to do before the nasal sprays are given) - We will start allergy  shots as a means of long-term control. - Allergy  shots re-train and reset the immune system to ignore environmental allergens and decrease the resulting immune response to those allergens (sneezing, itchy watery eyes, runny nose, nasal congestion, etc).    - Allergy  shots improve symptoms in 75-85% of patients.   2. Return in about 3 months (around 12/18/2023). You can have the follow up appointment with Dr. Iva or a Nurse Practicioner (our Nurse Practitioners are excellent and always have Physician oversight!).    Please inform us  of any Emergency Department visits, hospitalizations, or changes in symptoms. Call us  before going to the ED for breathing or allergy  symptoms since we might be able to fit you in for a sick visit. Feel free to contact us  anytime with any questions, problems, or concerns.  It was a pleasure to meet you today!  Websites that have reliable patient information: 1. American Academy of Asthma, Allergy , and Immunology: www.aaaai.org 2. Food Allergy  Research and Education (FARE): foodallergy.org 3. Mothers of Asthmatics: http://www.asthmacommunitynetwork.org 4. American College of Allergy , Asthma, and Immunology: www.acaai.org      "Like" us  on Facebook and Instagram for our latest updates!      A healthy democracy works best when [employer]  participate! Make sure you are registered to vote! If you have moved or changed any of your contact information, you will need to get this updated before voting! Scan the QR codes below to learn more!       Airborne Adult Perc - 09/19/23 0947     Time Antigen Placed 0957    Allergen Manufacturer Jestine    Location Back    Number of Test 55    1. Control-Buffer 50% Glycerol Negative    2. Control-Histamine 2+    3. Bahia 4+    4. Bermuda 4+    5. Johnson 4+    6. [place]  Blue 4+    7. Meadow Fescue 4+    8. Perennial Rye 4+    9. Timothy 4+    10. Ragweed Mix Negative    11. Cocklebur Negative    12. Plantain,  English Negative    13. Baccharis Negative    14. Dog Fennel Negative    15. Russian Thistle Negative    16. Lamb's Quarters 3+    17. Sheep Sorrell Negative    18. Rough Pigweed Negative    19. Marsh Elder, Rough Negative    20. Mugwort, Common 2+    21. Box, Elder Negative    22. Cedar, red Negative    23. Sweet Gum Negative    24. Pecan Pollen Negative    25. Pine Mix Negative    26. Walnut, Black Pollen Negative    27. Red Mulberry Negative    28. Ash Mix Negative    29. Birch Mix Negative    30. [place] American Negative  31. Cottonwood, Eastern Negative    32. Hickory, White Negative    33. Maple Mix Negative    34. Oak, Eastern Mix Negative    35. Sycamore Eastern Negative    36. Alternaria Alternata Negative    37. Cladosporium Herbarum Negative    38. Aspergillus Mix Negative    39. Penicillium Mix Negative    40. Bipolaris Sorokiniana (Helminthosporium) Negative    41. Drechslera Spicifera (Curvularia) Negative    42. Mucor Plumbeus Negative    43. Fusarium Moniliforme Negative    44. Aureobasidium Pullulans (pullulara) Negative    45. Rhizopus Oryzae Negative    46. Botrytis Cinera Negative    47. Epicoccum Nigrum Negative    48. Phoma Betae Negative    49. Dust Mite Mix 4+    50. Cat Hair 10,000 BAU/ml 3+    51.  Dog Epithelia 3+    52.  Mixed Feathers Negative    53. Horse Epithelia Negative    54. Cockroach, German Negative    55. Tobacco Leaf Negative             13 Food Perc - 09/19/23 0948       Test Information   Time Antigen Placed 0957    Allergen Manufacturer Jestine    Location Back    Number of allergen test 13      Food   1. Peanut Negative    2. Soybean Negative    3. Wheat Negative    4. Sesame Negative    5. Milk, Cow Negative    6. Casein Negative    7. Egg White, Chicken Negative    8. Shellfish Mix Negative    9. Fish Mix Negative    10. Cashew Negative    11. Walnut Food Negative    12. Almond Negative    13. Hazelnut Negative             Intradermal - 09/19/23 1015     Time Antigen Placed 1015    Allergen Manufacturer Greer    Location Arm    Number of Test 8    Control Negative    Ragweed Mix 3+    Tree Mix Negative    Mold 1 Negative    Mold 2 1+    Mold 3 Negative    Mold 4 1+    Cockroach Negative             Reducing Pollen Exposure  The American Academy of Allergy , Asthma and Immunology suggests the following steps to reduce your exposure to pollen during allergy  seasons.    Do not hang sheets or clothing out to dry; pollen may collect on these items. Do not mow lawns or spend time around freshly cut grass; mowing stirs up pollen. Keep windows closed at night.  Keep car windows closed while driving. Minimize morning activities outdoors, a time when pollen counts are usually at their highest. Stay indoors as much as possible when pollen counts or humidity is high and on windy days when pollen tends to remain in the air longer. Use air conditioning when possible.  Many air conditioners have filters that trap the pollen spores. Use a HEPA room air filter to remove pollen form the indoor air you breathe.  Control of Mold Allergen   Mold and fungi can grow on a variety of surfaces provided certain temperature and moisture conditions exist.  Outdoor molds  grow on plants, decaying vegetation and soil.  The major  outdoor mold, Alternaria and Cladosporium, are found in very high numbers during hot and dry conditions.  Generally, a late Summer - Fall peak is seen for common outdoor fungal spores.  Rain will temporarily lower outdoor mold spore count, but counts rise rapidly when the rainy period ends.  The most important indoor molds are Aspergillus and Penicillium.  Dark, humid and poorly ventilated basements are ideal sites for mold growth.  The next most common sites of mold growth are the bathroom and the kitchen.   Indoor (Perennial) Mold Control   Positive indoor molds via skin testing: Aspergillus, Penicillium, Fusarium, Aureobasidium (Pullulara), and Rhizopus  Maintain humidity below 50%. Clean washable surfaces with 5% bleach solution. Remove sources e.g. contaminated carpets.    Control of Dust Mite Allergen    Dust mites play a major role in allergic asthma and rhinitis.  They occur in environments with high humidity wherever human skin is found.  Dust mites absorb humidity from the atmosphere (ie, they do not drink) and feed on organic matter (including shed human and animal skin).  Dust mites are a microscopic type of insect that you cannot see with the naked eye.  High levels of dust mites have been detected from mattresses, pillows, carpets, upholstered furniture, bed covers, clothes, soft toys and any woven material.  The principal allergen of the dust mite is found in its feces.  A gram of dust may contain 1,000 mites and 250,000 fecal particles.  Mite antigen is easily measured in the air during house cleaning activities.  Dust mites do not bite and do not cause harm to humans, other than by triggering allergies/asthma.    Ways to decrease your exposure to dust mites in your home:  Encase mattresses, box springs and pillows with a mite-impermeable barrier or cover   Wash sheets, blankets and drapes weekly in hot water (130 F) with  detergent and dry them in a dryer on the hot setting.  Have the room cleaned frequently with a vacuum cleaner and a damp dust-mop.  For carpeting or rugs, vacuuming with a vacuum cleaner equipped with a high-efficiency particulate air (HEPA) filter.  The dust mite allergic individual should not be in a room which is being cleaned and should wait 1 hour after cleaning before going into the room. Do not sleep on upholstered furniture (eg, couches).   If possible removing carpeting, upholstered furniture and drapery from the home is ideal.  Horizontal blinds should be eliminated in the rooms where the person spends the most time (bedroom, study, television room).  Washable vinyl, roller-type shades are optimal. Remove all non-washable stuffed toys from the bedroom.  Wash stuffed toys weekly like sheets and blankets above.   Reduce indoor humidity to less than 50%.  Inexpensive humidity monitors can be purchased at most hardware stores.  Do not use a humidifier as can make the problem worse and are not recommended.  Control of Dog or Cat Allergen  Avoidance is the best way to manage a dog or cat allergy . If you have a dog or cat and are allergic to dog or cats, consider removing the dog or cat from the home. If you have a dog or cat but don't want to find it a new home, or if your family wants a pet even though someone in the household is allergic, here are some strategies that may help keep symptoms at bay:  Keep the pet out of your bedroom and restrict it to only a few rooms.  Be advised that keeping the dog or cat in only one room will not limit the allergens to that room. Don't pet, hug or kiss the dog or cat; if you do, wash your hands with soap and water. High-efficiency particulate air (HEPA) cleaners run continuously in a bedroom or living room can reduce allergen levels over time. Regular use of a high-efficiency vacuum cleaner or a central vacuum can reduce allergen levels. Giving your dog or  cat a bath at least once a week can reduce airborne allergen.  Allergy  Shots  Allergies are the result of a chain reaction that starts in the immune system. Your immune system controls how your body defends itself. For instance, if you have an allergy  to pollen, your immune system identifies pollen as an invader or allergen. Your immune system overreacts by producing antibodies called Immunoglobulin E (IgE). These antibodies travel to cells that release chemicals, causing an allergic reaction.  The concept behind allergy  immunotherapy, whether it is received in the form of shots or tablets, is that the immune system can be desensitized to specific allergens that trigger allergy  symptoms. Although it requires time and patience, the payback can be long-term relief. Allergy  injections contain a dilute solution of those substances that you are allergic to based upon your skin testing and allergy  history.   How Do Allergy  Shots Work?  Allergy  shots work much like a vaccine. Your body responds to injected amounts of a particular allergen given in increasing doses, eventually developing a resistance and tolerance to it. Allergy  shots can lead to decreased, minimal or no allergy  symptoms.  There generally are two phases: build-up and maintenance. Build-up often ranges from three to six months and involves receiving injections with increasing amounts of the allergens. The shots are typically given once or twice a week, though more rapid build-up schedules are sometimes used.  The maintenance phase begins when the most effective dose is reached. This dose is different for each person, depending on how allergic you are and your response to the build-up injections. Once the maintenance dose is reached, there are longer periods between injections, typically two to four weeks.  Occasionally doctors give cortisone-type shots that can temporarily reduce allergy  symptoms. These types of shots are different and should  not be confused with allergy  immunotherapy shots.  Who Can Be Treated with Allergy  Shots?  Allergy  shots may be a good treatment approach for people with allergic rhinitis (hay fever), allergic asthma, conjunctivitis (eye allergy ) or stinging insect allergy .   Before deciding to begin allergy  shots, you should consider:   The length of allergy  season and the severity of your symptoms  Whether medications and/or changes to your environment can control your symptoms  Your desire to avoid long-term medication use  Time: allergy  immunotherapy requires a major time commitment  Cost: may vary depending on your insurance coverage  Allergy  shots for children age 49 and older are effective and often well tolerated. They might prevent the onset of new allergen sensitivities or the progression to asthma.  Allergy  shots are not started on patients who are pregnant but can be continued on patients who become pregnant while receiving them. In some patients with other medical conditions or who take certain common medications, allergy  shots may be of risk. It is important to mention other medications you talk to your allergist.   What are the two types of build-ups offered:   RUSH or Rapid Desensitization -- one day of injections lasting from 8:30-4:30pm, injections every 1  hour.  Approximately half of the build-up process is completed in that one day.  The following week, normal build-up is resumed, and this entails ~16 visits either weekly or twice weekly, until reaching your "maintenance dose" which is continued weekly until eventually getting spaced out to every month for a duration of 3 to 5 years. The regular build-up appointments are nurse visits where the injections are administered, followed by required monitoring for 30 minutes.    Traditional build-up -- weekly visits for 6 -12 months until reaching "maintenance dose", then continue weekly until eventually spacing out to every 4 weeks as above.  At these appointments, the injections are administered, followed by required monitoring for 30 minutes.     Either way is acceptable, and both are equally effective. With the rush protocol, the advantage is that less time is spent here for injections overall AND you would also reach maintenance dosing faster (which is when the clinical benefit starts to become more apparent). Not everyone is a candidate for rapid desensitization.   IF we proceed with the RUSH protocol, there are premedications which must be taken the day before and the day after the rush only (this includes antihistamines, steroids, and Singulair).  After the rush day, no prednisone or Singulair is required, and we just recommend antihistamines taken on your injection day.  What Is An Estimate of the Costs?  If you are interested in starting allergy  injections, please check with your insurance company about your coverage for both allergy  vial sets and allergy  injections.  Please do so prior to making the appointment to start injections.  The following are CPT codes to give to your insurance company. These are the amounts we BILL to the insurance company, but the amount YOU WILL PAY and WE RECEIVE IS SUBSTANTIALLY LESS and depends on the contracts we have with different insurance companies.   Amount Billed to Insurance One allergy  vial set  CPT 95165   $ 1200     Two allergy  vial set  CPT 95165   $ 2400     Three allergy  vial set  CPT 95165   $ 3600     One injection   CPT 95115   $ 35  Two injections   CPT 95117   $ 40 RUSH (Rapid Desensitization) CPT 95180 x 8 hours $500/hour  Regarding the allergy  injections, your co-pay may or may not apply with each injection, so please confirm this with your insurance company. When you start allergy  injections, 1 or 2 sets of vials are made based on your allergies.  Not all patients can be on one set of vials. A set of vials lasts 6 months to a year depending on how quickly you can proceed  with your build-up of your allergy  injections. Vials are personalized for each patient depending on their specific allergens.  How often are allergy  injection given during the build-up period?   Injections are given at least weekly during the build-up period until your maintenance dose is achieved. Per the doctor's discretion, you may have the option of getting allergy  injections two times per week during the build-up period. However, there must be at least 48 hours between injections. The build-up period is usually completed within 6-12 months depending on your ability to schedule injections and for adjustments for reactions. When maintenance dose is reached, your injection schedule is gradually changed to every two weeks and later to every three weeks. Injections will then continue every 4 weeks. Usually, injections are  continued for a total of 3-5 years.   When Will I Feel Better?  Some may experience decreased allergy  symptoms during the build-up phase. For others, it may take as long as 12 months on the maintenance dose. If there is no improvement after a year of maintenance, your allergist will discuss other treatment options with you.  If you aren't responding to allergy  shots, it may be because there is not enough dose of the allergen in your vaccine or there are missing allergens that were not identified during your allergy  testing. Other reasons could be that there are high levels of the allergen in your environment or major exposure to non-allergic triggers like tobacco smoke.  What Is the Length of Treatment?  Once the maintenance dose is reached, allergy  shots are generally continued for three to five years. The decision to stop should be discussed with your allergist at that time. Some people may experience a permanent reduction of allergy  symptoms. Others may relapse and a longer course of allergy  shots can be considered.  What Are the Possible Reactions?  The two types of adverse  reactions that can occur with allergy  shots are local and systemic. Common local reactions include very mild redness and swelling at the injection site, which can happen immediately or several hours after. Report a delayed reaction from your last injection. These include arm swelling or runny nose, watery eyes or cough that occurs within 12-24 hours after injection. A systemic reaction, which is less common, affects the entire body or a particular body system. They are usually mild and typically respond quickly to medications. Signs include increased allergy  symptoms such as sneezing, a stuffy nose or hives.   Rarely, a serious systemic reaction called anaphylaxis can develop. Symptoms include swelling in the throat, wheezing, a feeling of tightness in the chest, nausea or dizziness. Most serious systemic reactions develop within 30 minutes of allergy  shots. This is why it is strongly recommended you wait in your doctor's office for 30 minutes after your injections. Your allergist is trained to watch for reactions, and his or her staff is trained and equipped with the proper medications to identify and treat them.   Report to the nurse immediately if you experience any of the following symptoms: swelling, itching or redness of the skin, hives, watery eyes/nose, breathing difficulty, excessive sneezing, coughing, stomach pain, diarrhea, or light headedness. These symptoms may occur within 15-20 minutes after injection and may require medication.   Who Should Administer Allergy  Shots?  The preferred location for receiving shots is your prescribing allergist's office. Injections can sometimes be given at another facility where the physician and staff are trained to recognize and treat reactions, and have received instructions by your prescribing allergist.  What if I am late for an injection?   Injection dose will be adjusted depending upon how many days or weeks you are late for your injection.   What if  I am sick?   Please report any illness to the nurse before receiving injections. She may adjust your dose or postpone injections depending on your symptoms. If you have fever, flu, sinus infection or chest congestion it is best to postpone allergy  injections until you are better. Never get an allergy  injection if your asthma is causing you problems. If your symptoms persist, seek out medical care to get your health problem under control.  What If I am or Become Pregnant:  Women that become pregnant should schedule an appointment with [facility]  before receiving any further allergy  injections.

## 2023-09-19 NOTE — Progress Notes (Signed)
 NEW PATIENT  Date of Service/Encounter:  09/19/23  Consult requested by: Bulah Alm RAMAN, PA-C   Assessment:   Seasonal and perennial allergic rhinitis (grasses, ragweed, weeds, indoor molds, dust mites, cat, and dog) - interested in starting allergen immunotherapy  Concern for food allergies - with negative testing to the most common foods  Plan/Recommendations:   1. Chronic rhinitis - Testing today showed: grasses, ragweed, weeds, indoor molds, dust mites, cat, and dog - Copy of test results provided.  - Avoidance measures provided. - Start taking: Xyzal  (levocetirizine) 5mg  tablet once daily and Flonase  (fluticasone ) one spray per nostril daily (AIM FOR EAR ON EACH SIDE) - You can use an extra dose of the antihistamine, if needed, for breakthrough symptoms.  - Consider nasal saline rinses 1-2 times daily to remove allergens from the nasal cavities as well as help with mucous clearance (this is especially helpful to do before the nasal sprays are given) - We will start allergy  shots as a means of long-term control. - Allergy  shots re-train and reset the immune system to ignore environmental allergens and decrease the resulting immune response to those allergens (sneezing, itchy watery eyes, runny nose, nasal congestion, etc).    - Allergy  shots improve symptoms in 75-85% of patients.   2. Return in about 3 months (around 12/18/2023). You can have the follow up appointment with Dr. Iva or a Nurse Practicioner (our Nurse Practitioners are excellent and always have Physician oversight!).    This note in its entirety was forwarded to the Provider who requested this consultation.  Subjective:   [name] is a 47 y.o. female presenting today for evaluation of  Chief Complaint  Patient presents with   Allergies    [name] has a history of the following: Patient Active Problem List   Diagnosis Date Noted   Family history of thyroid  disease 09/05/2023    Vitamin D  deficiency 09/05/2023   Screening for lipid disorders 09/05/2023   Cyst of skin 09/05/2023   History of umbilical hernia 07/14/2022   Family history of premature coronary artery disease 11/26/2019   Chronic constipation 10/04/2018   Popping sound of knee joint 02/02/2018   Crepitus of both knee joints 02/02/2018   Family history of uterine cancer 08/30/2016   Chronic allergic rhinitis 08/30/2016   Daytime somnolence 08/30/2016   Encounter for health maintenance examination in adult 09/25/2015   Encounter for surveillance of contraceptives 09/25/2015   Chronic fatigue 09/25/2015   Family history of sleep apnea 09/25/2015   Screen for STD (sexually transmitted disease) 09/25/2015    History obtained from: chart review and patient.  Discussed the use of AI scribe software for clinical note transcription with the patient and/or guardian, who gave verbal consent to proceed.  [name] was referred by Bulah Alm RAMAN, PA-C.     [name] is a 47 y.o. female presenting for an evaluation of environmental allergens .    [name] presents with a significant increase in symptoms over the past year. She reports a constant nasal drip, requiring tissues to be kept at hand at all times. Despite recent improvement in the past few weeks, she has experienced a year of worsening, severe symptoms that have not responded to changes in environment, including a trip to [place], or alterations in bedding.   She also reports nocturnal symptoms, including a tickling sensation in the nose leading to sneezing and nasal discharge. This often disrupts sleep and is inconsistent, occurring on some nights  but not others. She has a history of sinusitis, but manages it well to prevent it from reaching severe stages. She has had pets all her life and acknowledges a possible allergic reaction to them, but chooses to ignore it.  She has tried various over-the-counter antihistamines and nasal sprays, but  discontinued use due to lack of efficacy. She occasionally uses a nasal spray when symptoms are particularly severe.  She seems to tolerate all the major food allergens without a problem, but she is wondering if it contributes to her rhinitis symptoms.  She has never been tested for allergies before, despite having a child with multiple confirmed allergies. She consumes a regular diet, including potential allergens such as peanuts and wheat, without noticeable adverse reactions. However, she is open to testing for food allergies.   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   Otherwise, there is no history of other atopic diseases, including drug allergies, stinging insect allergies, or contact dermatitis. There is no significant infectious history. Vaccinations are up to date.    Past Medical History: Patient Active Problem List   Diagnosis Date Noted   Family history of thyroid  disease 09/05/2023   Vitamin D  deficiency 09/05/2023   Screening for lipid disorders 09/05/2023   Cyst of skin 09/05/2023   History of umbilical hernia 07/14/2022   Family history of premature coronary artery disease 11/26/2019   Chronic constipation 10/04/2018   Popping sound of knee joint 02/02/2018   Crepitus of both knee joints 02/02/2018   Family history of uterine cancer 08/30/2016   Chronic allergic rhinitis 08/30/2016   Daytime somnolence 08/30/2016   Encounter for health maintenance examination in adult 09/25/2015   Encounter for surveillance of contraceptives 09/25/2015   Chronic fatigue 09/25/2015   Family history of sleep apnea 09/25/2015   Screen for STD (sexually transmitted disease) 09/25/2015    Medication List:  Allergies as of 09/19/2023       Reactions   Amoxicillin Hives   Cephalosporins Itching, Swelling, Hives    Mirena [levonorgestrel]    Recurrent BV, bleeding   Penicillins Itching, Other (See Comments), Swelling, Hives   Patient states that she can't move.        Medication List        Accurate as of September 19, 2023 11:39 AM. If you have any questions, ask your nurse or doctor.          Clindamycin -Benzoyl Per (Refr) gel Apply 1 Application topically 2 (two) times daily.   EPINEPHrine  0.3 mg/0.3 mL Soaj injection Commonly known as: EpiPen  2-Pak Inject 0.3 mg into the muscle once for 1 dose. Started by: Wessley Emert Louis Shaynna Husby   fluticasone  50 MCG/ACT nasal spray Commonly known as: FLONASE  Place 2 sprays into both nostrils daily.   levocetirizine 5 MG tablet Commonly known as: XYZAL  Take 1 tablet (5 mg total) by mouth every evening. Started by: Marty Morton Shaggy   Vitamin D  (Ergocalciferol ) 1.25 MG (50000 UNIT) Caps capsule Commonly known as: DRISDOL  Take 1 capsule (50,000 Units total) by mouth every 7 (seven) days.        Birth History: non-contributory  Developmental History: non-contributory  Past Surgical History: Past Surgical History:  Procedure Laterality Date   AUGMENTATION MAMMAPLASTY Bilateral 07/03/2017   BREAST ENHANCEMENT SURGERY  07/09/2016   [facility]   CESAREAN SECTION     twice   COLONOSCOPY  08/2022   normal, repeat 10 years   COSMETIC SURGERY  06/2016   tummy tuck, [facility]   HERNIA REPAIR  2014   umbilical     Family History: Family History  Problem Relation Age of Onset   Hypertension Mother    Heart disease Mother 37       MI, CHF, CABG   Thyroid  disease Mother    Cancer Mother 56       uterine cancer, treated, then liver with mets later   Other Mother        lumbar DDD   Arthritis Father    Diabetes Father    Varicose Veins Father    Anemia Sister    Depression Sister    Cancer Maternal Grandmother        pancreatic   Arthritis Paternal Grandmother    Heart disease Paternal  Grandmother    Urticaria Daughter    Eczema Daughter    Angioedema Daughter    Allergic rhinitis Daughter    Stroke Neg Hx    Asthma Neg Hx      Social History: Blondell lives at home with her family.  She lives in a house that is 46 years old.  There is hardwood and carpet throughout the home.  They have electric heating and central cooling.  There are 2 cats inside of the home.  There are dust mite covers on the bed, but not the pillows.  There is no tobacco exposure.  She currently works at [employer].  She has been doing this for 5 years.  There is no fume, chemical, or dust exposure.  She does have a HEPA filter in the home.  She does not live near an interstate or industrial area.  There is no tobacco exposure.   Review of systems otherwise negative other than that mentioned in the HPI.    Objective:   Blood pressure 122/86, pulse 66, temperature 98 F (36.7 C), temperature source Temporal, resp. rate 16, height 5' 2.21 (1.58 m), weight 147 lb 3.2 oz (66.8 kg), last menstrual period 08/29/2023, SpO2 98%. Body mass index is 26.75 kg/m.     Physical Exam Constitutional:      Appearance: She is well-developed.  HENT:     Head: Normocephalic and atraumatic.     Right Ear: Tympanic membrane, ear canal and external ear normal. No drainage, swelling or tenderness. Tympanic membrane is not injected, scarred, erythematous, retracted or bulging.     Left Ear: Tympanic membrane, ear canal and external ear normal. No drainage, swelling or tenderness. Tympanic membrane is not injected, scarred, erythematous, retracted or bulging.     Nose: No nasal deformity, septal deviation, mucosal edema or rhinorrhea.     Right Turbinates: Enlarged, swollen and pale.     Left Turbinates: Enlarged, swollen and pale.     Right Sinus: No maxillary sinus tenderness or frontal sinus tenderness.     Left Sinus: No maxillary sinus tenderness or frontal sinus tenderness.     Comments: No nasal  polyps.    Mouth/Throat:     Mouth: Mucous membranes are not pale and not dry.     Pharynx: Uvula midline.  Eyes:     General:        Right eye: No discharge.        Left eye: No discharge.     Conjunctiva/sclera: Conjunctivae  normal.     Right eye: Right conjunctiva is not injected. No chemosis.    Left eye: Left conjunctiva is not injected. No chemosis.    Pupils: Pupils are equal, round, and reactive to light.  Cardiovascular:     Rate and Rhythm: Normal rate and regular rhythm.     Heart sounds: Normal heart sounds.  Pulmonary:     Effort: Pulmonary effort is normal. No tachypnea, accessory muscle usage or respiratory distress.     Breath sounds: Normal breath sounds. No wheezing, rhonchi or rales.     Comments: Moving air well in all lung fields.  No increased work of breathing. Chest:     Chest wall: No tenderness.  Abdominal:     Tenderness: There is no abdominal tenderness. There is no guarding or rebound.  Lymphadenopathy:     Head:     Right side of head: No submandibular, tonsillar or occipital adenopathy.     Left side of head: No submandibular, tonsillar or occipital adenopathy.     Cervical: No cervical adenopathy.  Skin:    Coloration: Skin is not pale.     Findings: No abrasion, erythema, petechiae or rash. Rash is not papular, urticarial or vesicular.  Neurological:     Mental Status: She is alert.  Psychiatric:        Behavior: Behavior is cooperative.      Diagnostic studies:   Allergy  Studies:     Airborne Adult Perc - 09/19/23 0947     Time Antigen Placed 0957    Allergen Manufacturer Jestine    Location Back    Number of Test 55    1. Control-Buffer 50% Glycerol Negative    2. Control-Histamine 2+    3. Bahia 4+    4. Bermuda 4+    5. Johnson 4+    6. [place]  Blue 4+    7. Meadow Fescue 4+    8. Perennial Rye 4+    9. Timothy 4+    10. Ragweed Mix Negative    11. Cocklebur Negative    12. Plantain,  English Negative    13. Baccharis  Negative    14. Dog Fennel Negative    15. Russian Thistle Negative    16. Lamb's Quarters 3+    17. Sheep Sorrell Negative    18. Rough Pigweed Negative    19. Marsh Elder, Rough Negative    20. Mugwort, Common 2+    21. Box, Elder Negative    22. Cedar, red Negative    23. Sweet Gum Negative    24. Pecan Pollen Negative    25. Pine Mix Negative    26. Walnut, Black Pollen Negative    27. Red Mulberry Negative    28. Ash Mix Negative    29. Birch Mix Negative    30. Beech American Negative    31. Cottonwood, Eastern Negative    32. Hickory, White Negative    33. Maple Mix Negative    34. Oak, Eastern Mix Negative    35. Sycamore Eastern Negative    36. Alternaria Alternata Negative    37. Cladosporium Herbarum Negative    38. Aspergillus Mix Negative    39. Penicillium Mix Negative    40. Bipolaris Sorokiniana (Helminthosporium) Negative    41. Drechslera Spicifera (Curvularia) Negative    42. Mucor Plumbeus Negative    43. Fusarium Moniliforme Negative    44. Aureobasidium Pullulans (pullulara) Negative    45. Rhizopus Oryzae Negative  46. Botrytis Cinera Negative    47. Epicoccum Nigrum Negative    48. Phoma Betae Negative    49. Dust Mite Mix 4+    50. Cat Hair 10,000 BAU/ml 3+    51.  Dog Epithelia 3+    52. Mixed Feathers Negative    53. Horse Epithelia Negative    54. Cockroach, German Negative    55. Tobacco Leaf Negative             13 Food Perc - 09/19/23 0948       Test Information   Time Antigen Placed 0957    Allergen Manufacturer Jestine    Location Back    Number of allergen test 13      Food   1. Peanut Negative    2. Soybean Negative    3. Wheat Negative    4. Sesame Negative    5. Milk, Cow Negative    6. Casein Negative    7. Egg White, Chicken Negative    8. Shellfish Mix Negative    9. Fish Mix Negative    10. Cashew Negative    11. Walnut Food Negative    12. Almond Negative    13. Hazelnut Negative              Intradermal - 09/19/23 1015     Time Antigen Placed 1015    Allergen Manufacturer Greer    Location Arm    Number of Test 8    Control Negative    Ragweed Mix 3+    Tree Mix Negative    Mold 1 Negative    Mold 2 1+    Mold 3 Negative    Mold 4 1+    Cockroach Negative             Allergy  testing results were read and interpreted by myself, documented by clinical staff.         Marty Shaggy, MD [facility] 

## 2023-09-28 ENCOUNTER — Other Ambulatory Visit (HOSPITAL_COMMUNITY): Payer: Self-pay

## 2023-10-11 ENCOUNTER — Other Ambulatory Visit: Payer: Self-pay | Admitting: Medical

## 2023-10-13 NOTE — Telephone Encounter (Signed)
Checking with Caryn about referral status

## 2023-10-19 ENCOUNTER — Other Ambulatory Visit (HOSPITAL_COMMUNITY): Payer: Self-pay

## 2023-10-19 ENCOUNTER — Telehealth: Payer: Self-pay

## 2023-10-19 NOTE — Telephone Encounter (Signed)
Pt has now trialed and failed one alternative. Now submitting for Clindamycin Phos-Benzoyl Perox 1-5% ge   Pharmacy Patient Advocate Encounter   Received notification from Physician's Office that prior authorization for Clindamycin Phos-Benzoyl Perox 1-5% gel is required/requested.   Insurance verification completed.   The patient is insured through [facility] .   Per test claim: PA required; PA submitted to above mentioned insurance via CoverMyMeds Key/confirmation #/EOC (Key: [id number])    Status is pending

## 2023-10-20 ENCOUNTER — Other Ambulatory Visit (HOSPITAL_COMMUNITY): Payer: Self-pay

## 2023-10-20 NOTE — Telephone Encounter (Signed)
Pharmacy Patient Advocate Encounter  Received notification from [facility] that Prior Authorization for Clindamycin Phos-Benzoyl Perox 1-5% gel has been APPROVED from 1.30.25 to 1.30.26. Ran test claim, Copay is $RTS, pt pickedup rx on 1/28 . This test claim was processed through [facility]- copay amounts may vary at other pharmacies due to pharmacy/plan contracts, or as the patient moves through the different stages of their insurance plan.    PA #/Case ID/Reference #:  (Key: [id number])

## 2023-11-10 ENCOUNTER — Other Ambulatory Visit: Payer: Self-pay | Admitting: Medical

## 2023-11-10 DIAGNOSIS — Z0000 Encounter for general adult medical examination without abnormal findings: Secondary | ICD-10-CM

## 2023-12-05 ENCOUNTER — Ambulatory Visit: Payer: Medicaid Other | Admitting: Allergy & Immunology

## 2023-12-21 DIAGNOSIS — M9915 Subluxation complex (vertebral) of pelvic region: Secondary | ICD-10-CM | POA: Diagnosis not present

## 2023-12-21 DIAGNOSIS — M9912 Subluxation complex (vertebral) of thoracic region: Secondary | ICD-10-CM | POA: Diagnosis not present

## 2023-12-21 DIAGNOSIS — M9914 Subluxation complex (vertebral) of sacral region: Secondary | ICD-10-CM | POA: Diagnosis not present

## 2023-12-21 DIAGNOSIS — M9913 Subluxation complex (vertebral) of lumbar region: Secondary | ICD-10-CM | POA: Diagnosis not present

## 2023-12-25 ENCOUNTER — Ambulatory Visit
Admission: RE | Admit: 2023-12-25 | Discharge: 2023-12-25 | Disposition: A | Discharge status: Home / Self Care | Payer: Medicaid Other | Source: Ambulatory Visit | Attending: Medical | Admitting: Medical

## 2023-12-25 DIAGNOSIS — Z0000 Encounter for general adult medical examination without abnormal findings: Secondary | ICD-10-CM

## 2023-12-25 DIAGNOSIS — Z1231 Encounter for screening mammogram for malignant neoplasm of breast: Secondary | ICD-10-CM | POA: Diagnosis not present

## 2023-12-27 ENCOUNTER — Telehealth: Payer: Self-pay

## 2023-12-27 NOTE — Progress Notes (Signed)
 Mammogram shows area of concern and other intervention recommended.  She should be getting a call back.  If not heard back within 1 week, call us back.

## 2023-12-27 NOTE — Telephone Encounter (Signed)
 Copied from CRM [id number]. Topic: Clinical - Lab/Test Results >> Dec 27, 2023  2:35 PM Higinio Roger wrote: Reason for CRM: Patient has further questions about her imaging results.  Pt is very anxious to go ahead and get the next step regarding further investigation to her mammogram results. Advised pt that PCP is with pt's all day. Advised wil send a note over to convey her anxiety and need tog get to the next step ASAP.

## 2023-12-28 ENCOUNTER — Other Ambulatory Visit: Payer: Self-pay | Admitting: Medical

## 2023-12-28 DIAGNOSIS — N6320 Unspecified lump in the left breast, unspecified quadrant: Secondary | ICD-10-CM

## 2023-12-28 DIAGNOSIS — R928 Other abnormal and inconclusive findings on diagnostic imaging of breast: Secondary | ICD-10-CM

## 2023-12-31 IMAGING — MG DIGITAL SCREENING BREAST BILAT IMPLANT W/ TOMO W/ CAD
9 of 12 series · 9 of 28 positions shown · non-contrast
Comparison: Previous exam(s).

CLINICAL DATA: Screening.

EXAM:
DIGITAL SCREENING BILATERAL MAMMOGRAM WITH IMPLANTS, CAD AND
TOMOSYNTHESIS
TECHNIQUE: Bilateral screening digital craniocaudal and mediolateral oblique
mammograms were obtained. Bilateral screening digital breast
tomosynthesis was performed. The images were evaluated with
computer-aided detection. Standard and/or implant displaced views
were performed.

[R CC]
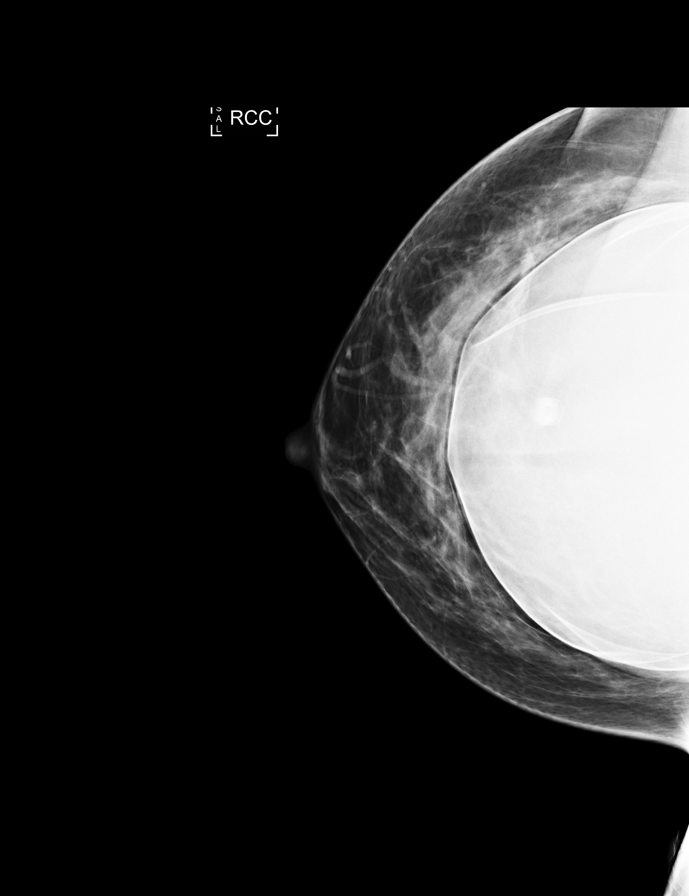

[R MLO]
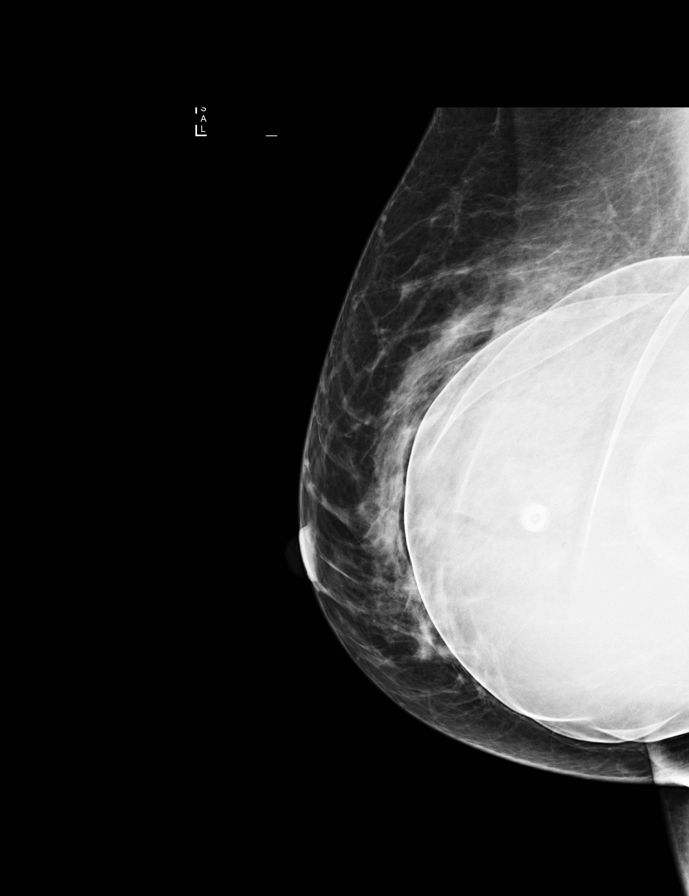

[L MLO]
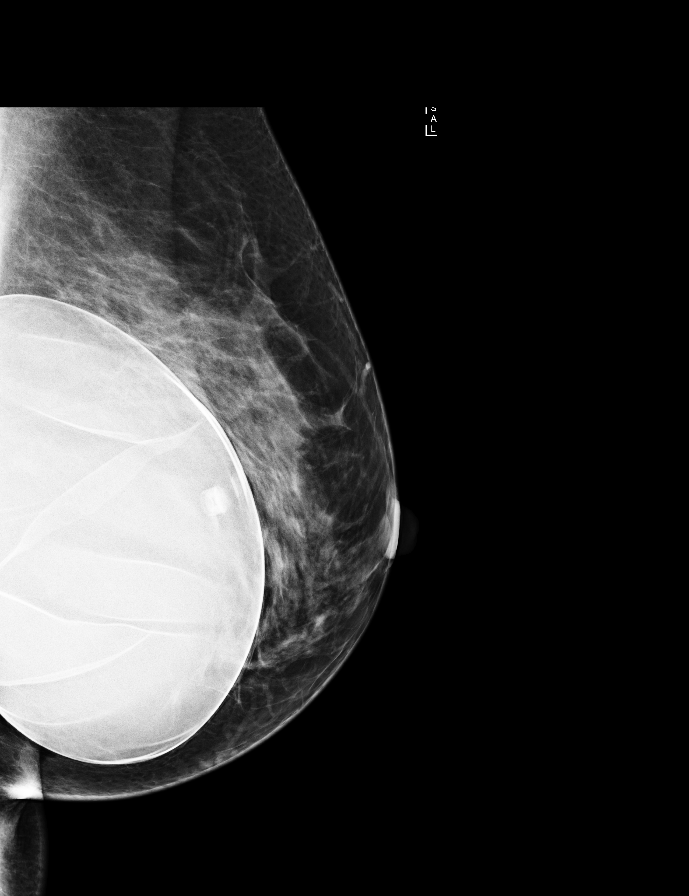

[L CC]
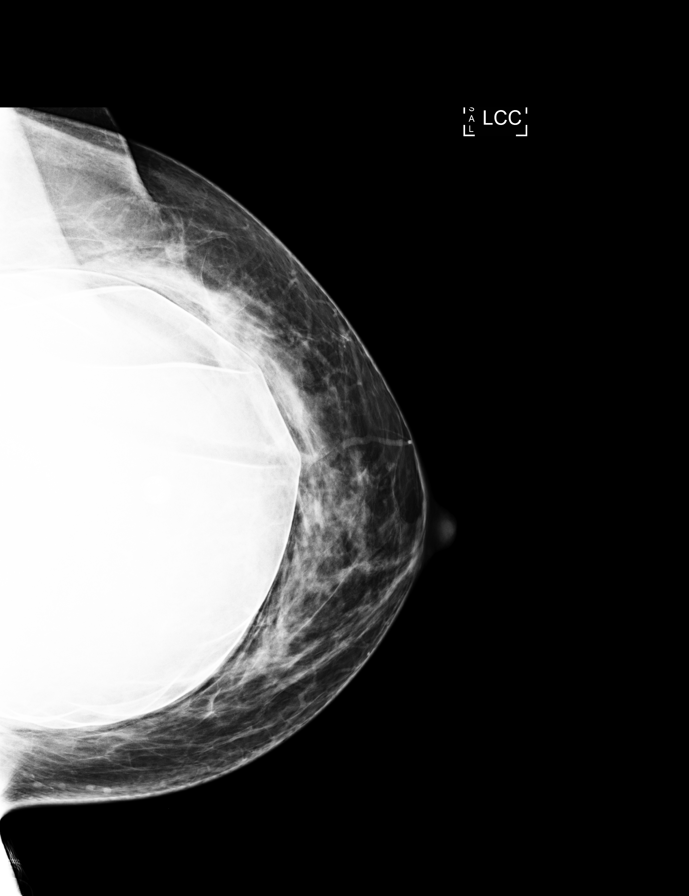

[L CC synth-2D]
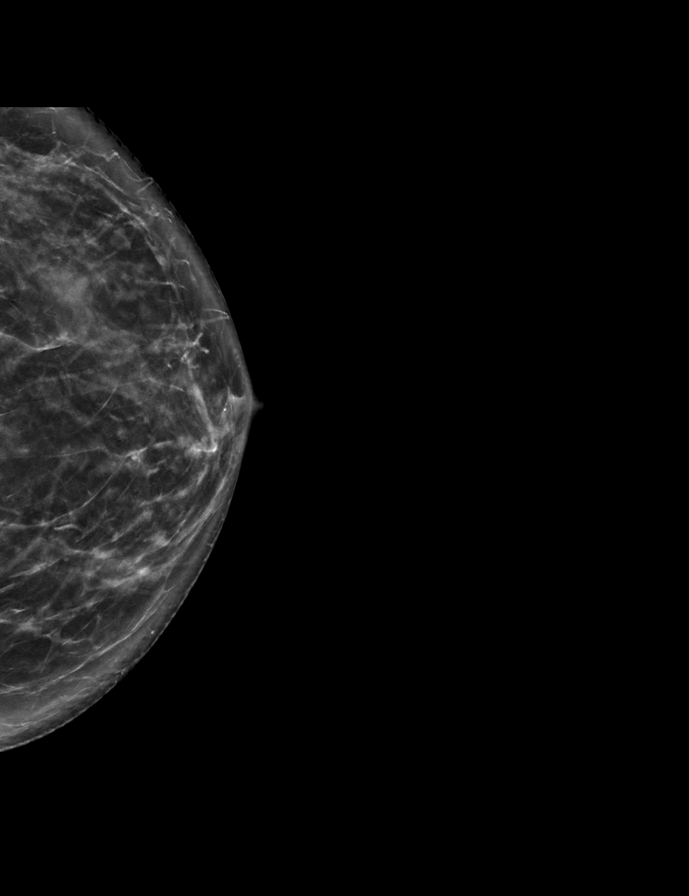

[R MLO synth-2D]
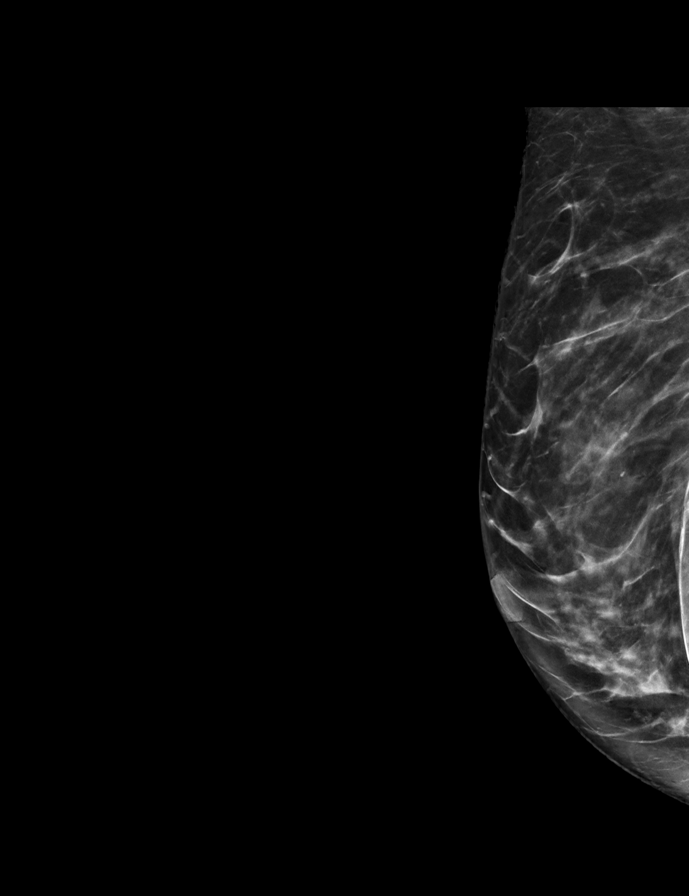

[L MLO synth-2D]
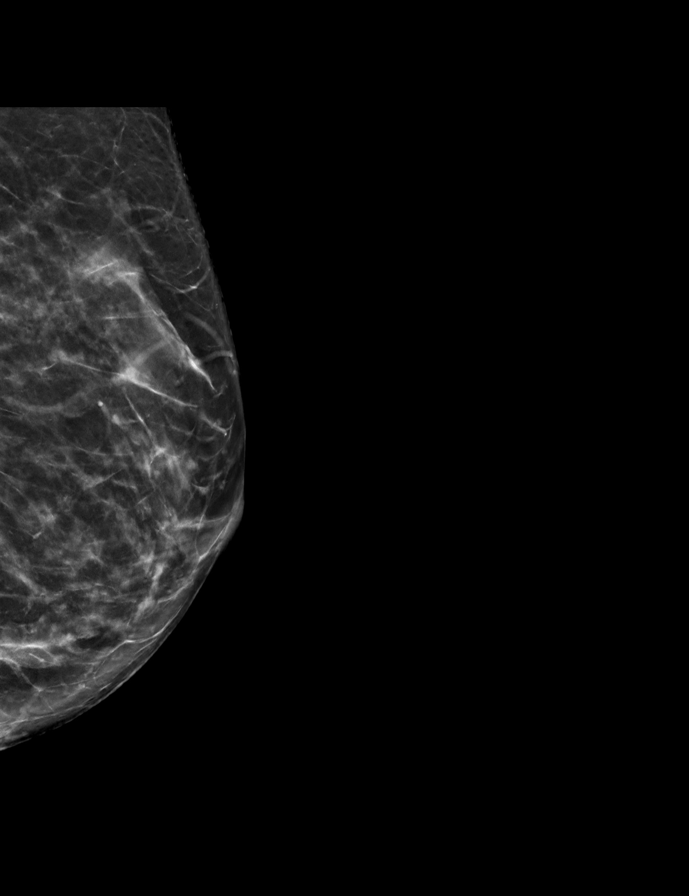

[R CC synth-2D]
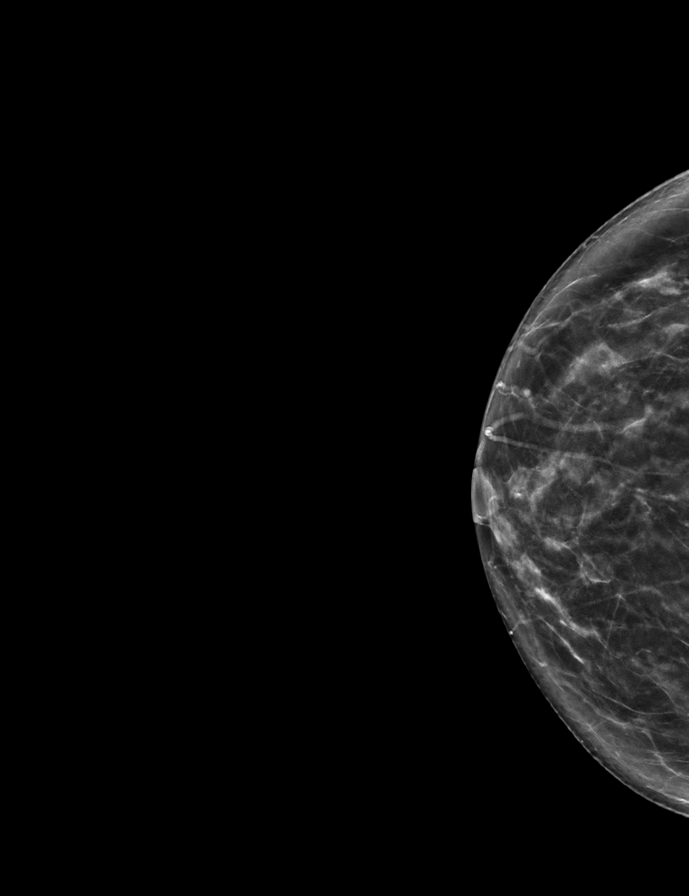

[R MLOID BREAST TOMOSYNTHESIS IMAGE tomo · tomo slice 37/72.0]
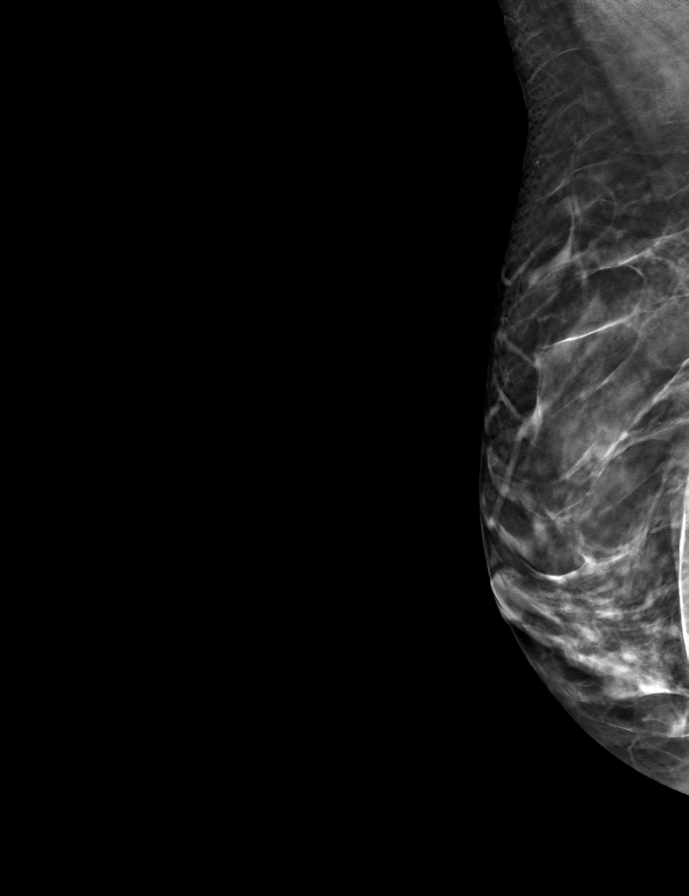

[9 of 28 positions shown; findings below may reference images not displayed]

ACR Breast Density Category b: There are scattered areas of
fibroglandular density.
FINDINGS: The patient has retropectoral implants. There are no findings
suspicious for malignancy.
IMPRESSION: No mammographic evidence of malignancy. A result letter of this
screening mammogram will be mailed directly to the patient.

RECOMMENDATION:
Screening mammogram in one year. (Code:SE-S-JMG)

BI-RADS CATEGORY  1:  Negative.

## 2024-01-03 ENCOUNTER — Ambulatory Visit
Admission: RE | Admit: 2024-01-03 | Discharge: 2024-01-03 | Disposition: A | Discharge status: Home / Self Care | Source: Ambulatory Visit | Attending: Medical | Admitting: Medical

## 2024-01-03 DIAGNOSIS — N6320 Unspecified lump in the left breast, unspecified quadrant: Secondary | ICD-10-CM

## 2024-01-03 DIAGNOSIS — N6321 Unspecified lump in the left breast, upper outer quadrant: Secondary | ICD-10-CM | POA: Diagnosis not present

## 2024-01-03 DIAGNOSIS — R928 Other abnormal and inconclusive findings on diagnostic imaging of breast: Secondary | ICD-10-CM

## 2024-01-03 DIAGNOSIS — N6322 Unspecified lump in the left breast, upper inner quadrant: Secondary | ICD-10-CM | POA: Diagnosis not present

## 2024-01-05 ENCOUNTER — Other Ambulatory Visit: Payer: Self-pay | Admitting: Medical

## 2024-01-08 NOTE — Telephone Encounter (Signed)
 Per [employer] Visit  Chronic allergic rhinitis  Discussed options for therapy Consider using Flonase  daily instead of prn She declines oral ant histamine currently She has new patient appt with allergist coming up later this month

## 2024-02-22 ENCOUNTER — Other Ambulatory Visit: Payer: Self-pay | Admitting: Medical

## 2024-06-05 ENCOUNTER — Ambulatory Visit: Admitting: Physician Assistant

## 2024-07-13 ENCOUNTER — Other Ambulatory Visit: Payer: Self-pay | Admitting: Allergy & Immunology

## 2024-07-30 ENCOUNTER — Telehealth: Payer: Self-pay | Admitting: Pharmacy Technician

## 2024-07-30 NOTE — Telephone Encounter (Signed)
 Pharmacy Patient Advocate Encounter   Received notification from Onbase that prior authorization for Trulance  3MG  tablets is due for renewal.   Insurance verification completed.   The patient is insured through [employer].  Action: Medication has been discontinued. Archived Key: [id number]

## 2024-10-19 ENCOUNTER — Other Ambulatory Visit: Payer: Self-pay | Admitting: Allergy & Immunology

## 2024-11-19 ENCOUNTER — Encounter: Admitting: Obstetrics & Gynecology
